# Patient Record
Sex: Female | Born: 1980 | Race: White | Hispanic: No | Marital: Married | Smoking: Never smoker
Health system: Southern US, Community
[De-identification: ages and names within clinical notes are randomized; demographics above are authoritative.]

## PROBLEM LIST (undated history)

## (undated) DIAGNOSIS — Z309 Encounter for contraceptive management, unspecified: Secondary | ICD-10-CM

## (undated) DIAGNOSIS — C801 Malignant (primary) neoplasm, unspecified: Secondary | ICD-10-CM

## (undated) DIAGNOSIS — N921 Excessive and frequent menstruation with irregular cycle: Secondary | ICD-10-CM

## (undated) HISTORY — DX: Encounter for contraceptive management, unspecified: Z30.9

## (undated) HISTORY — DX: Excessive and frequent menstruation with irregular cycle: N92.1

## (undated) HISTORY — DX: Malignant (primary) neoplasm, unspecified: C80.1

## (undated) HISTORY — PX: SKIN CANCER EXCISION: SHX779

## (undated) HISTORY — PX: TONSILLECTOMY: SUR1361

---

## 2007-04-15 ENCOUNTER — Ambulatory Visit (HOSPITAL_COMMUNITY): Admission: RE | Admit: 2007-04-15 | Discharge: 2007-04-15 | Payer: Self-pay | Admitting: Obstetrics & Gynecology

## 2008-06-23 ENCOUNTER — Other Ambulatory Visit: Admission: RE | Admit: 2008-06-23 | Discharge: 2008-06-23 | Payer: Self-pay | Admitting: Obstetrics and Gynecology

## 2008-12-09 IMAGING — US US PELVIS COMPLETE MODIFY
1 series · 14 of 25 positions shown · non-contrast
Comparison: None

CLINICAL DATA: Pelvic pressure

TRANSABDOMINAL AND TRANSVAGINAL PELVIC ULTRASOUND
TECHNIQUE: Both transabdominal and transvaginal ultrasound examinations of the
pelvis were performed including evaluation of the uterus, ovaries, adnexal
regions, and pelvic cul-de-sac.

[Series 1: us pelvis complete modify · 0.28mm/px · 14 of 58 slices shown]
[im 1/58]
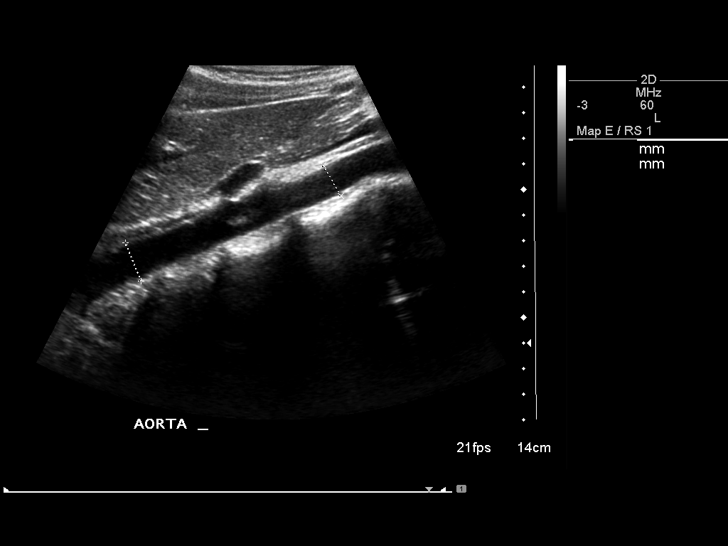
[im 5/58]
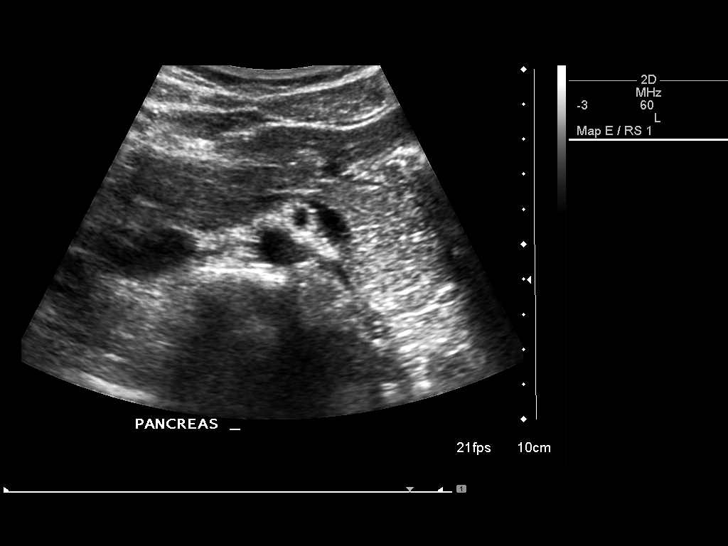
[im 10/58]
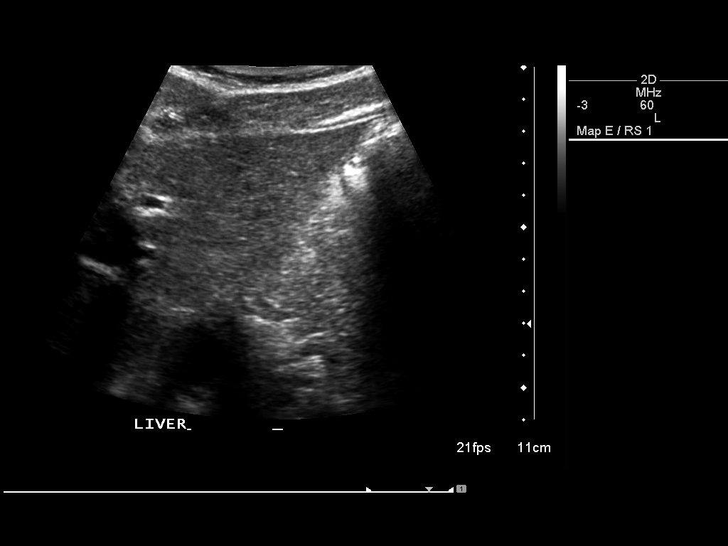
[im 15/58]
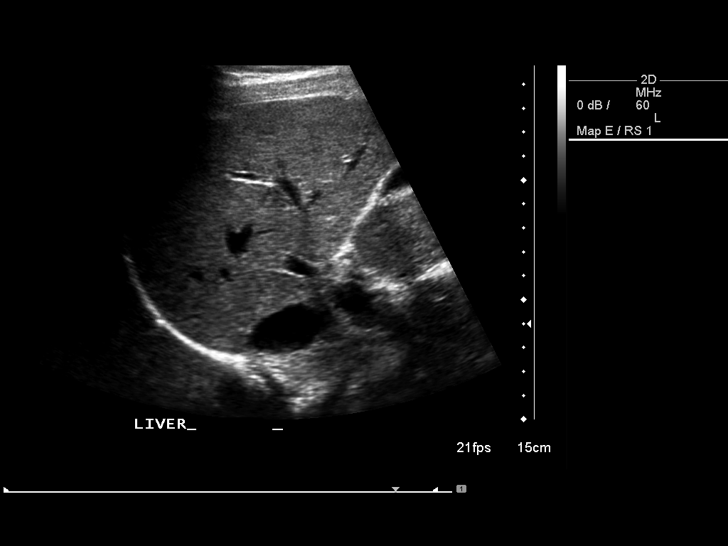
[im 20/58]
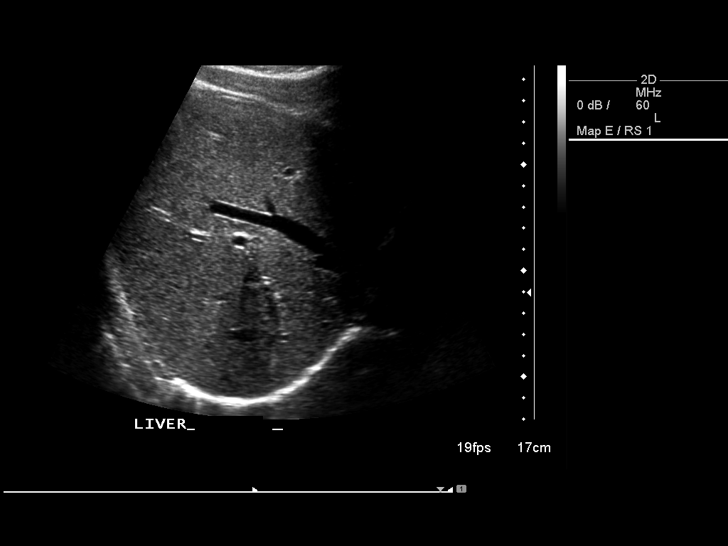
[im 22/58]
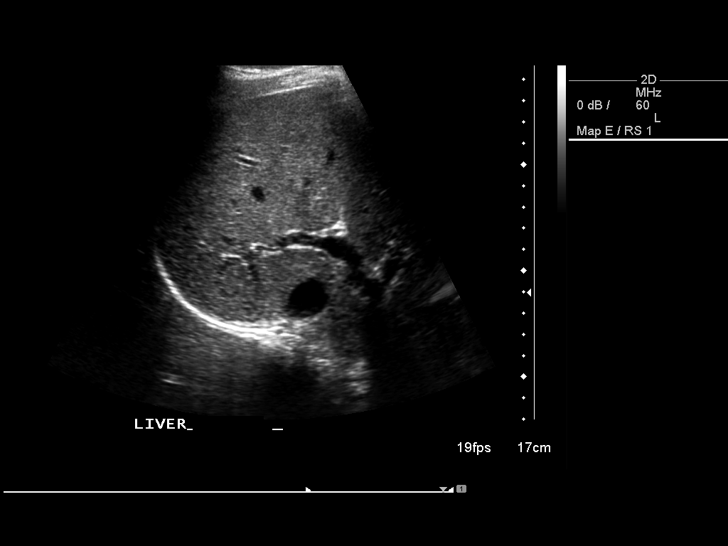
[im 27/58]
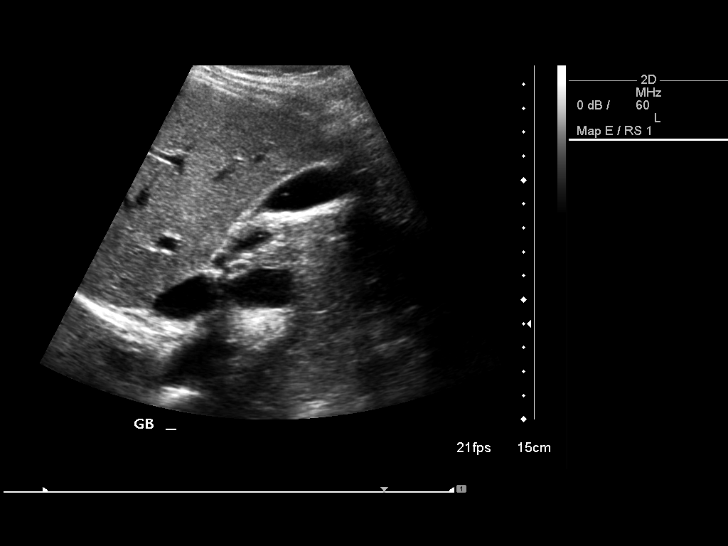
[im 31/58]
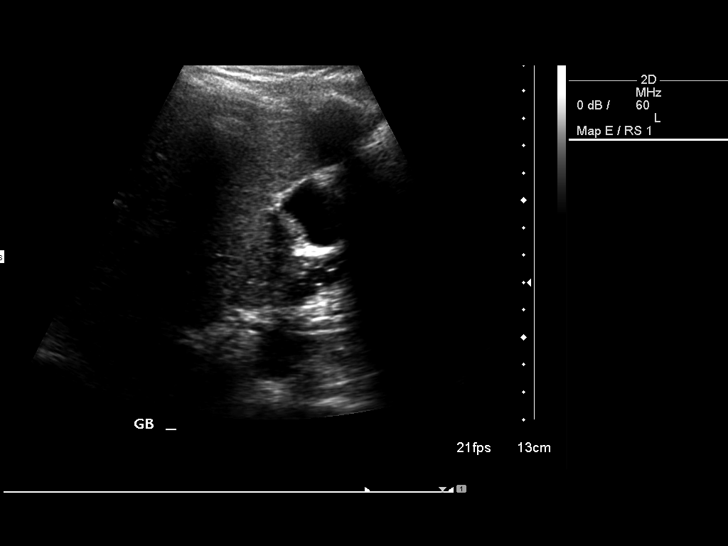
[im 36/58]
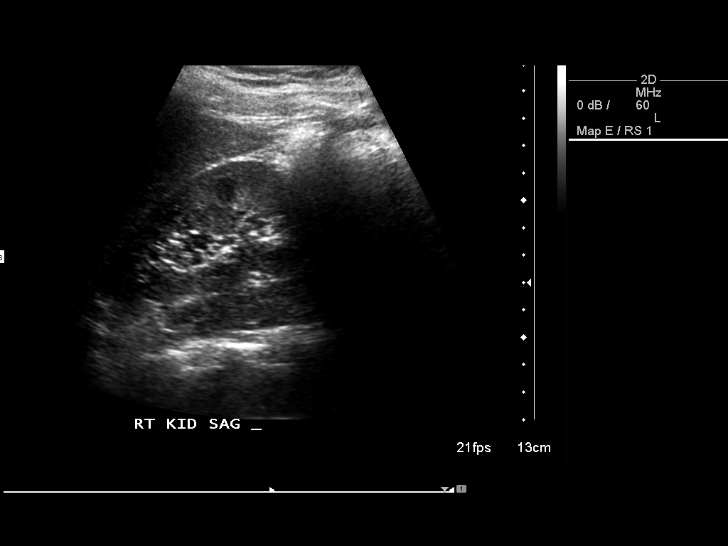
[im 39/58]
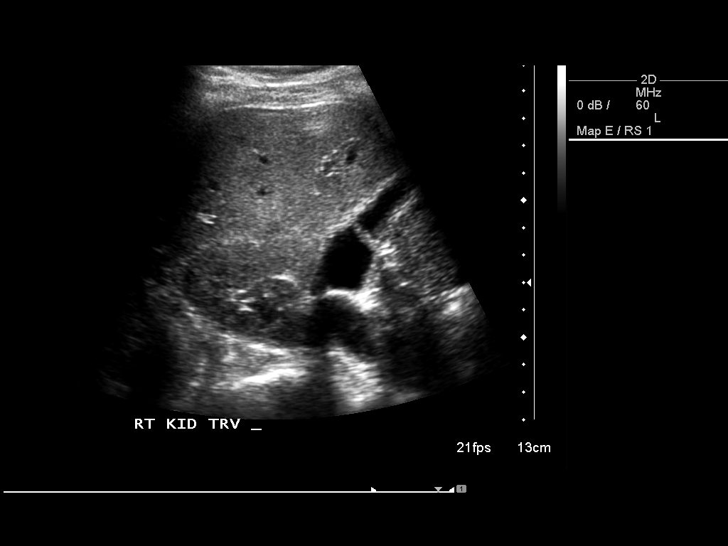
[im 43/58]
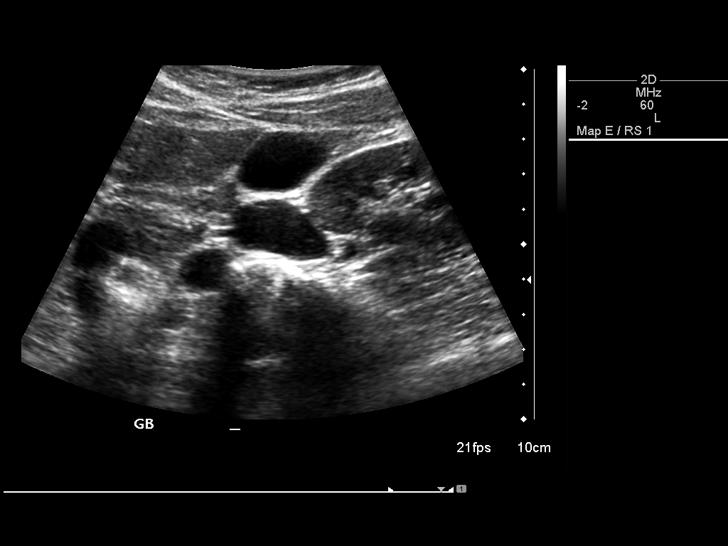
[im 48/58]
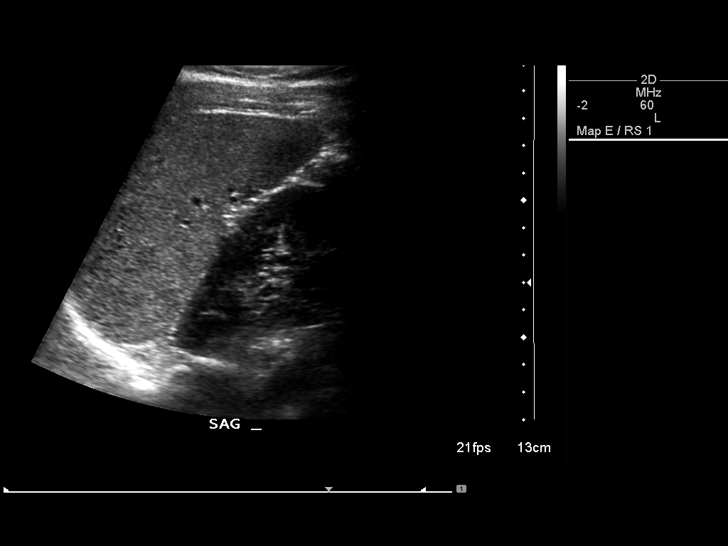
[im 53/58]
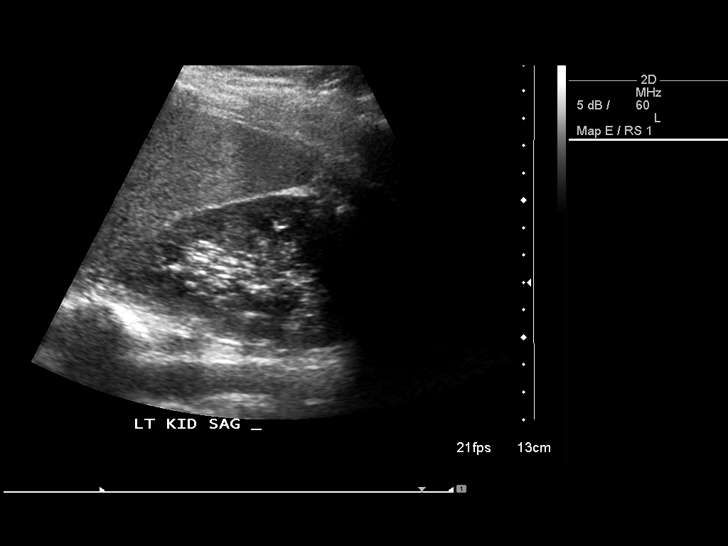
[im 58/58]
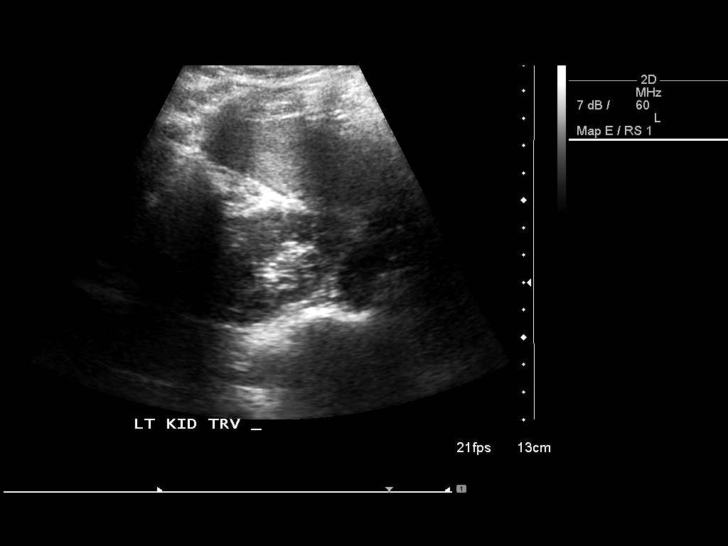

[14 of 25 positions shown; findings below may reference images not displayed]

FINDINGS: Uterus is normal appearance measuring 7.8 x 2.6 x 4.3 cm.

Endometrium is normal measuring 2 mm.

Right ovary is normal measuring 2.9 x 1.5 x 2.1 cm

Left ovary is normal measuring 3.1 x 1.6 x 1.7 cm.

There is no free fluid.

IMPRESSION

Normal pelvic sonogram.

## 2008-12-09 IMAGING — US US PELVIS COMPLETE MODIFY
1 series · 14 of 25 positions shown · non-contrast
Comparison: None

CLINICAL DATA: Pelvic pressure

TRANSABDOMINAL AND TRANSVAGINAL PELVIC ULTRASOUND
TECHNIQUE: Both transabdominal and transvaginal ultrasound examinations of the
pelvis were performed including evaluation of the uterus, ovaries, adnexal
regions, and pelvic cul-de-sac.

[Series 1: us pelvis complete modify · 0.24mm/px · 14 of 45 slices shown]
[im 1/45]
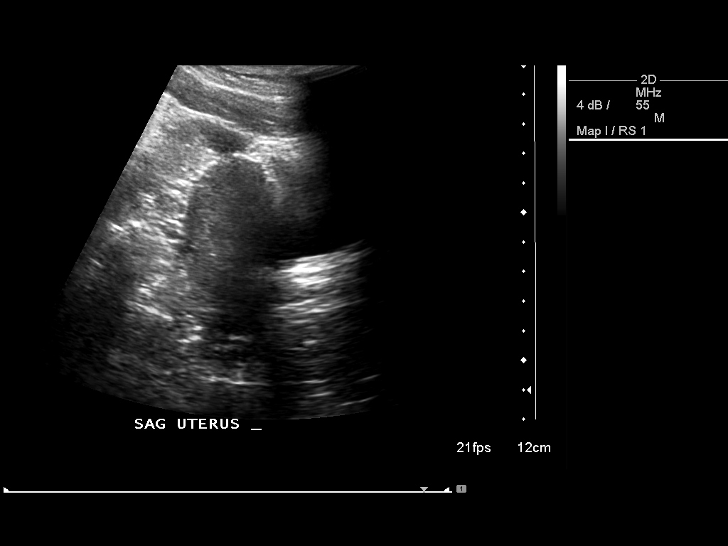
[im 4/45]
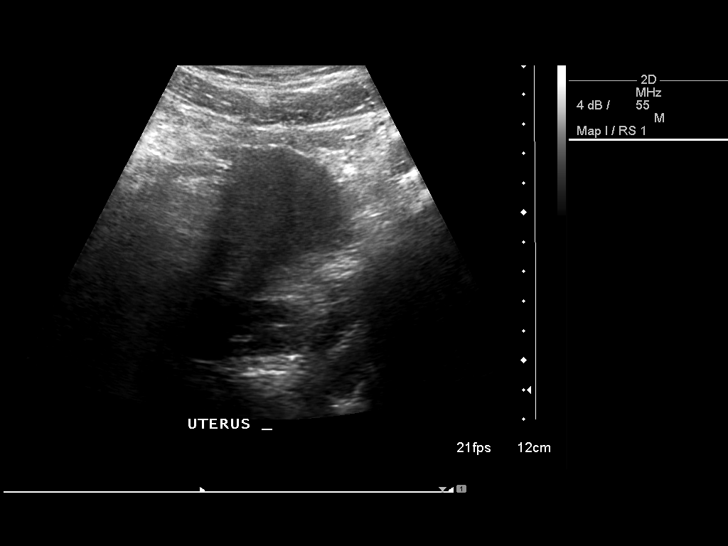
[im 8/45]
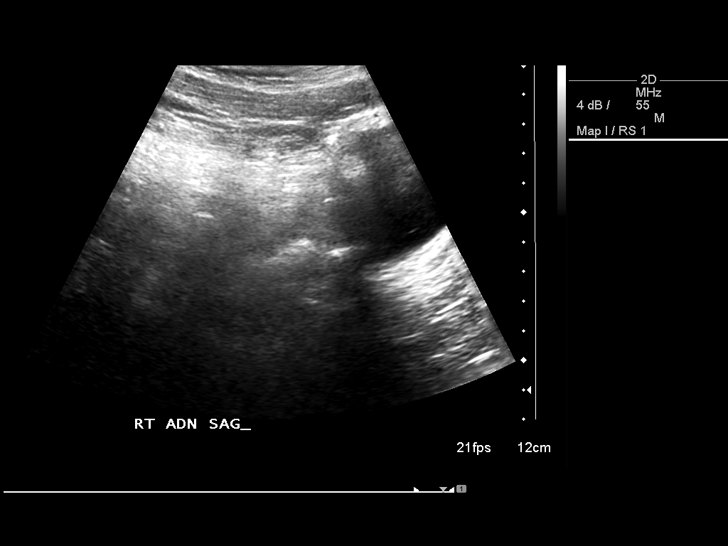
[im 12/45]
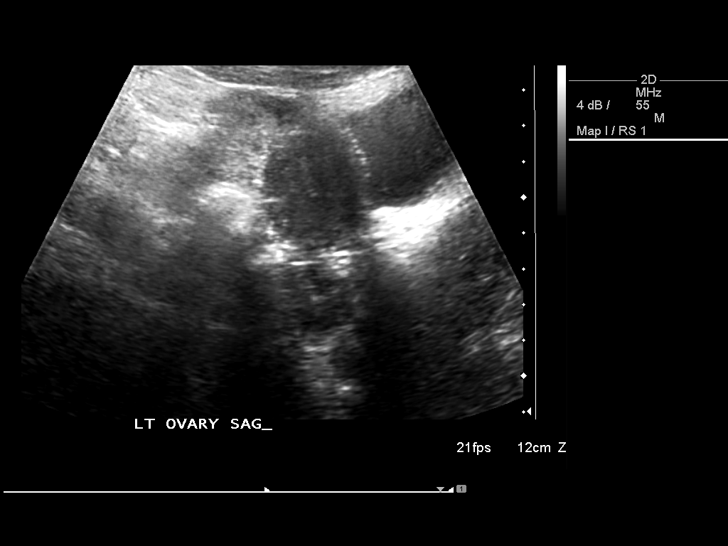
[im 15/45]
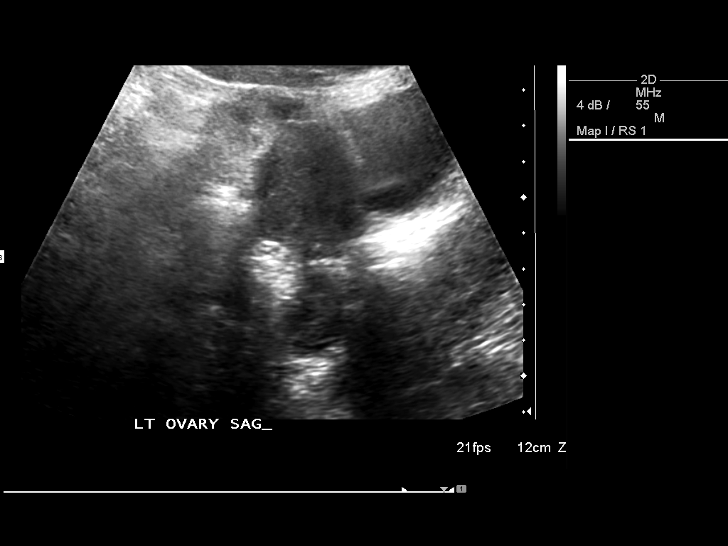
[im 17/45]
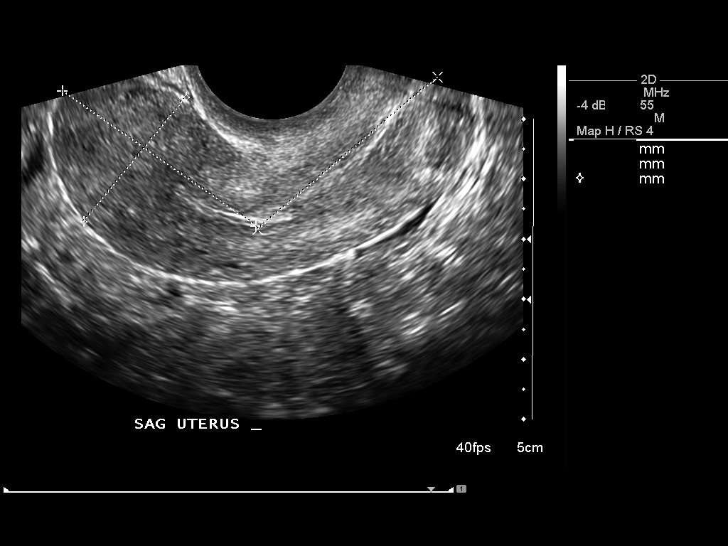
[im 21/45]
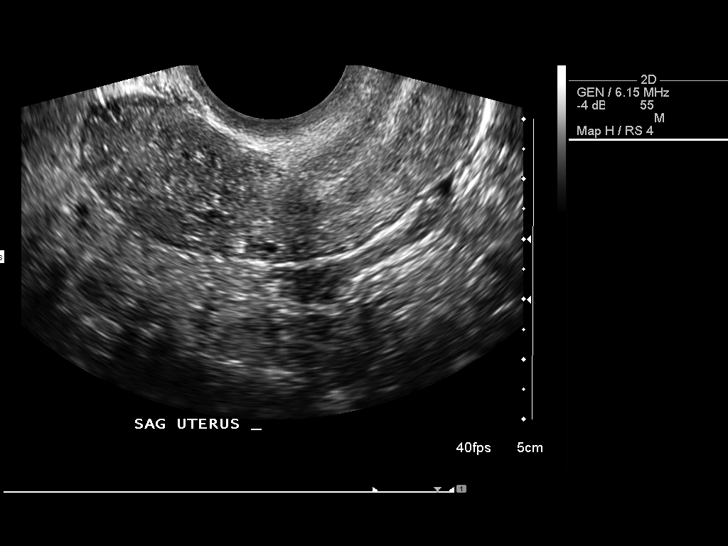
[im 24/45]
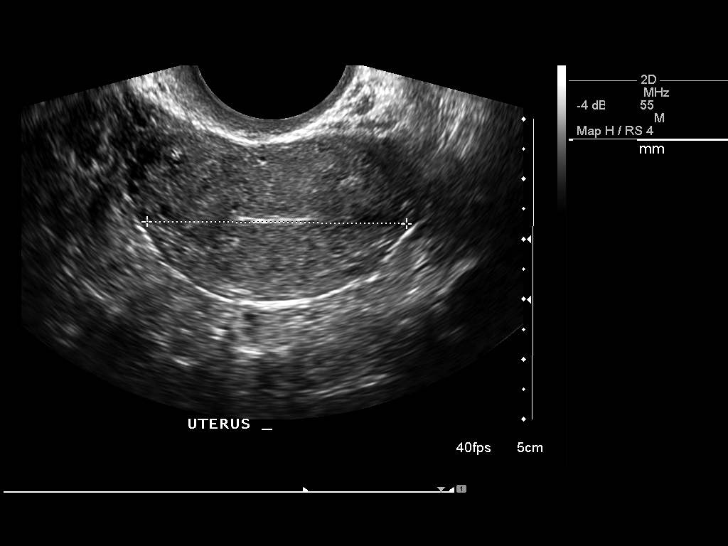
[im 28/45]
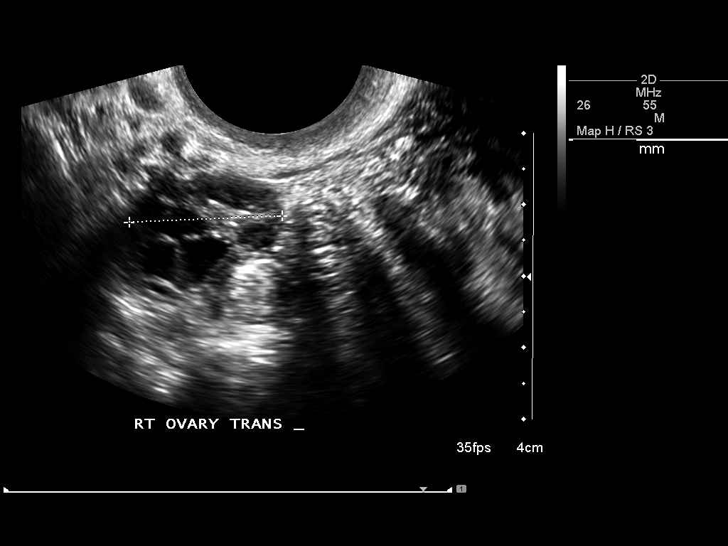
[im 30/45]
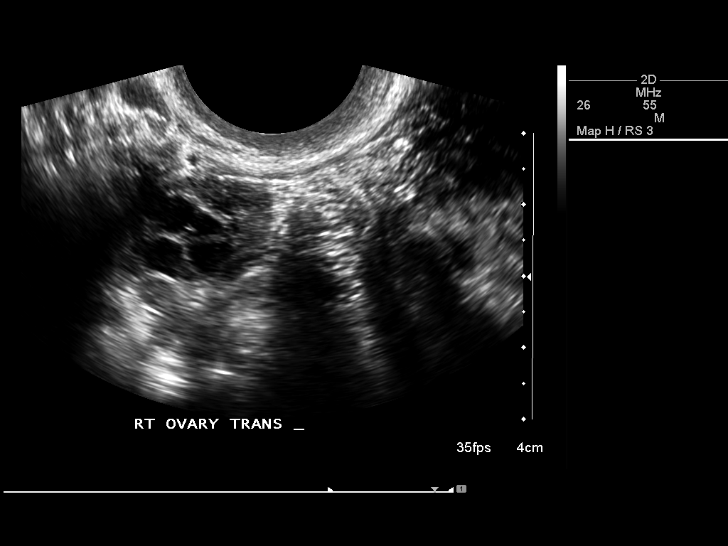
[im 34/45]
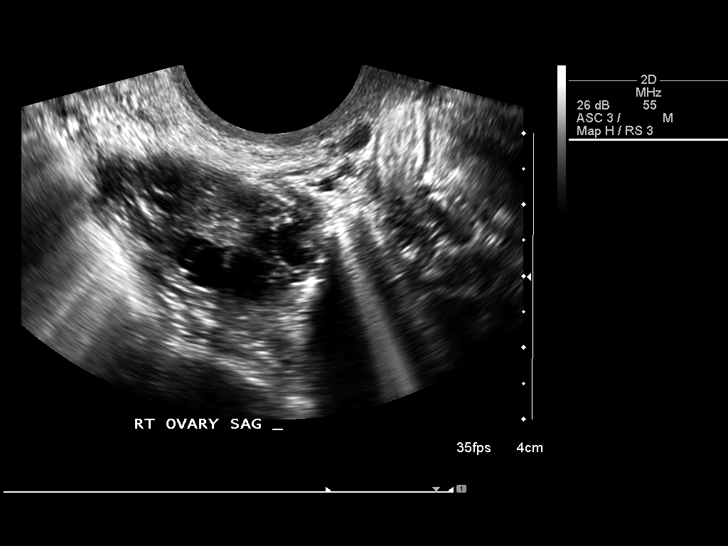
[im 37/45]
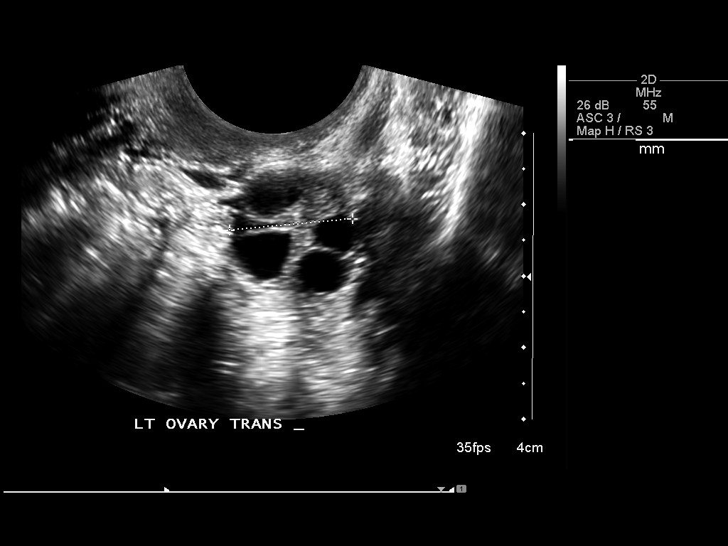
[im 41/45]
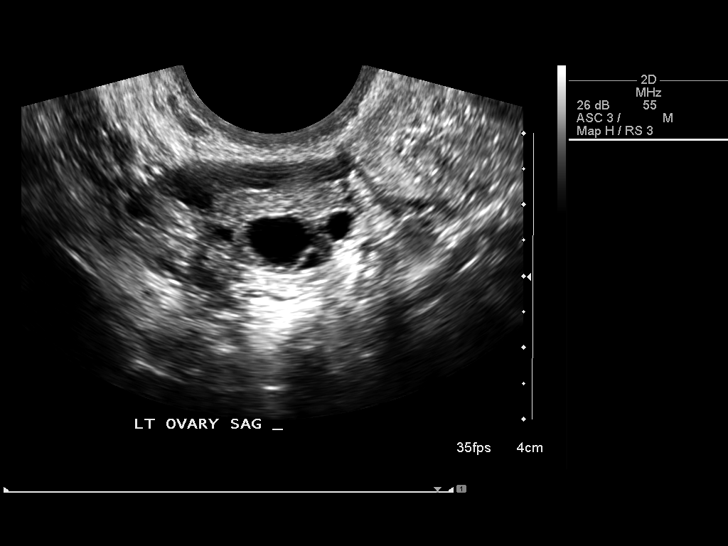
[im 45/45]
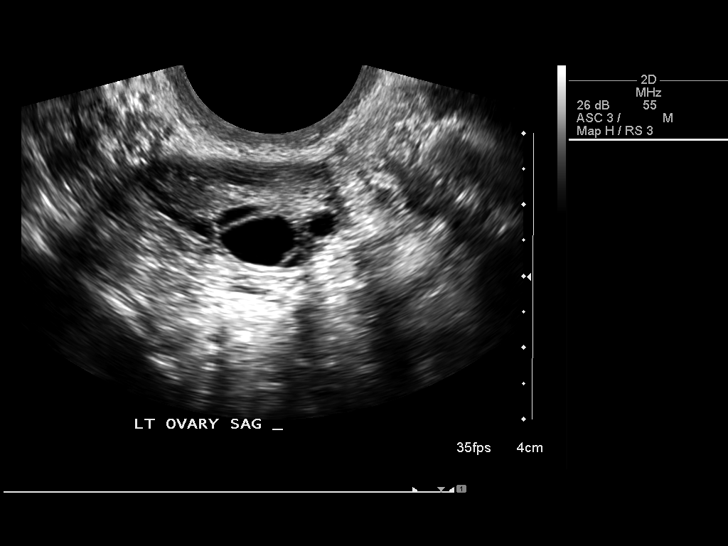

[14 of 25 positions shown; findings below may reference images not displayed]

FINDINGS: Uterus is normal appearance measuring 7.8 x 2.6 x 4.3 cm.

Endometrium is normal measuring 2 mm.

Right ovary is normal measuring 2.9 x 1.5 x 2.1 cm

Left ovary is normal measuring 3.1 x 1.6 x 1.7 cm.

There is no free fluid.

IMPRESSION

Normal pelvic sonogram.

## 2009-01-08 ENCOUNTER — Ambulatory Visit: Payer: Self-pay | Admitting: Family

## 2009-01-08 ENCOUNTER — Inpatient Hospital Stay (HOSPITAL_COMMUNITY): Admission: AD | Admit: 2009-01-08 | Discharge: 2009-01-10 | Payer: Self-pay | Admitting: Obstetrics and Gynecology

## 2009-01-15 ENCOUNTER — Emergency Department (HOSPITAL_COMMUNITY): Admission: EM | Admit: 2009-01-15 | Discharge: 2009-01-15 | Payer: Self-pay | Admitting: Emergency Medicine

## 2011-04-16 LAB — URINALYSIS, ROUTINE W REFLEX MICROSCOPIC
Bilirubin Urine: NEGATIVE
Glucose, UA: NEGATIVE mg/dL
Protein, ur: NEGATIVE mg/dL
Specific Gravity, Urine: 1.015 (ref 1.005–1.030)

## 2011-04-16 LAB — CBC
HCT: 39.5 % (ref 36.0–46.0)
Hemoglobin: 13.5 g/dL (ref 12.0–15.0)
MCV: 93.1 fL (ref 78.0–100.0)
RDW: 14.4 % (ref 11.5–15.5)

## 2011-04-16 LAB — URINE MICROSCOPIC-ADD ON

## 2012-01-01 NOTE — L&D Delivery Note (Signed)
CRISTELA STALDER is a 31 y.o. G3P1011 at [redacted]w[redacted]d who presented with SROM in active labor at 6 cm dilation. She progressed rapidly to full dilation.   Delivery Note At 11:44 PM a viable and healthy female was delivered via Vaginal, Spontaneous Delivery (Presentation: Vertex; Right Occiput Anterior).  APGAR: 8, 9; weight pending.   Placenta status: Intact, Spontaneous.  Cord: 3 vessels with the following complications: None.  Cord pH: n/a  Anesthesia: Local  Episiotomy: None Lacerations: 2nd degree Suture Repair: 3.0 vicryl Est. Blood Loss (mL): 200  Mom to postpartum.  Baby to nursery-stable.  Napoleon Form 11/12/2012, 12:53 AM

## 2012-01-29 ENCOUNTER — Other Ambulatory Visit (HOSPITAL_COMMUNITY)
Admission: RE | Admit: 2012-01-29 | Discharge: 2012-01-29 | Disposition: A | Discharge status: Home / Self Care | Payer: 59 | Source: Ambulatory Visit | Attending: Obstetrics and Gynecology | Admitting: Obstetrics and Gynecology

## 2012-01-29 DIAGNOSIS — Z01419 Encounter for gynecological examination (general) (routine) without abnormal findings: Secondary | ICD-10-CM | POA: Insufficient documentation

## 2012-03-12 LAB — OB RESULTS CONSOLE ABO/RH: RH Type: POSITIVE

## 2012-09-02 ENCOUNTER — Other Ambulatory Visit: Payer: Self-pay | Admitting: Obstetrics & Gynecology

## 2012-10-27 LAB — OB RESULTS CONSOLE GBS: GBS: NEGATIVE

## 2012-11-11 ENCOUNTER — Encounter (HOSPITAL_COMMUNITY): Payer: Self-pay | Admitting: Obstetrics

## 2012-11-11 ENCOUNTER — Encounter (HOSPITAL_COMMUNITY): Payer: Self-pay | Admitting: *Deleted

## 2012-11-11 ENCOUNTER — Inpatient Hospital Stay (HOSPITAL_COMMUNITY)
Admission: AD | Admit: 2012-11-11 | Discharge: 2012-11-13 | DRG: 775 | Disposition: A | Discharge status: Home / Self Care | Payer: 59 | Source: Ambulatory Visit | Attending: Obstetrics & Gynecology | Admitting: Obstetrics & Gynecology

## 2012-11-11 ENCOUNTER — Telehealth (HOSPITAL_COMMUNITY): Payer: Self-pay | Admitting: *Deleted

## 2012-11-11 MED ORDER — ONDANSETRON HCL 4 MG/2ML IJ SOLN: 4.0000 mg | Freq: Four times a day (QID) | INTRAMUSCULAR | Status: DC | PRN

## 2012-11-11 MED ORDER — LIDOCAINE HCL (PF) 1 % IJ SOLN
30.0000 mL | INTRAMUSCULAR | Status: DC | PRN
  Administered 2012-11-11: 30 mL via SUBCUTANEOUS

## 2012-11-11 MED ORDER — OXYTOCIN 10 UNIT/ML IJ SOLN
10.0000 [IU] | Freq: Once | INTRAMUSCULAR | Status: AC
  Administered 2012-11-11: 10 [IU] via INTRAMUSCULAR

## 2012-11-11 MED ORDER — LACTATED RINGERS IV SOLN: 500.0000 mL | INTRAVENOUS | Status: DC | PRN

## 2012-11-11 MED ORDER — BUTORPHANOL TARTRATE 1 MG/ML IJ SOLN
1.0000 mg | INTRAMUSCULAR | Status: DC | PRN
  Filled 2012-11-11: qty 1

## 2012-11-11 MED ORDER — OXYTOCIN 10 UNIT/ML IJ SOLN
INTRAMUSCULAR | Status: AC
  Filled 2012-11-11: qty 2

## 2012-11-11 MED ORDER — IBUPROFEN 600 MG PO TABS
600.0000 mg | ORAL_TABLET | Freq: Four times a day (QID) | ORAL | Status: DC | PRN
  Administered 2012-11-12: 600 mg via ORAL
  Filled 2012-11-11: qty 1

## 2012-11-11 MED ORDER — ACETAMINOPHEN 325 MG PO TABS: 650.0000 mg | ORAL_TABLET | ORAL | Status: DC | PRN

## 2012-11-11 MED ORDER — OXYCODONE-ACETAMINOPHEN 5-325 MG PO TABS: 1.0000 | ORAL_TABLET | ORAL | Status: DC | PRN

## 2012-11-11 MED ORDER — OXYTOCIN BOLUS FROM INFUSION: 500.0000 mL | INTRAVENOUS | Status: DC

## 2012-11-11 MED ORDER — LACTATED RINGERS IV SOLN: INTRAVENOUS | Status: DC

## 2012-11-11 MED ORDER — LIDOCAINE HCL (PF) 1 % IJ SOLN
INTRAMUSCULAR | Status: AC
  Filled 2012-11-11: qty 30

## 2012-11-11 MED ORDER — CITRIC ACID-SODIUM CITRATE 334-500 MG/5ML PO SOLN: 30.0000 mL | ORAL | Status: DC | PRN

## 2012-11-11 MED ORDER — OXYTOCIN 40 UNITS IN LACTATED RINGERS INFUSION - SIMPLE MED: 62.5000 mL/h | INTRAVENOUS | Status: DC

## 2012-11-11 MED ORDER — BUTORPHANOL TARTRATE 1 MG/ML IJ SOLN
1.0000 mg | INTRAMUSCULAR | Status: DC | PRN
  Administered 2012-11-11: 1 mg via INTRAMUSCULAR

## 2012-11-11 NOTE — MAU Note (Signed)
Dr. Thad Ranger at the bedside to assess patient.

## 2012-11-11 NOTE — H&P (Signed)
Sharon Murillo is a 31 y.o. female presenting for active labor. Maternal Medical History:  Reason for admission: Reason for admission: rupture of membranes and contractions.  Reason for Admission:   nauseaContractions: Onset was 1-2 hours ago.   Frequency: regular.   Perceived severity is strong.    Fetal activity: Perceived fetal activity is normal.   Last perceived fetal movement was within the past hour.    Prenatal complications: no prenatal complications Prenatal Complications - Diabetes: none.    OB History    Grav Para Term Preterm Abortions TAB SAB Ect Mult Living   3 1 1  1  1   1      Past Medical History  Diagnosis Date  . Cancer     skin   Past Surgical History  Procedure Date  . Tonsillectomy   . Skin cancer excision    Family History: family history is negative for Other. Social History:  reports that she has never smoked. She has never used smokeless tobacco. She reports that she does not drink alcohol or use illicit drugs.   Prenatal Transfer Tool  Maternal Diabetes: No Genetic Screening: Declined Maternal Ultrasounds/Referrals: Normal Fetal Ultrasounds or other Referrals:  None Maternal Substance Abuse:  No Significant Maternal Medications:  None Significant Maternal Lab Results:  Lab values include: Group B Strep negative Other Comments:  None  Review of Systems  Constitutional: Negative for fever and chills.  Gastrointestinal: Negative for nausea and vomiting.    Dilation: 6 Effacement (%): 80 Station: -2 Exam by:: L. Munford RN Blood pressure 136/70, pulse 104, temperature 97.5 F (36.4 C), temperature source Oral, resp. rate 20, last menstrual period 01/23/2012. Maternal Exam:  Uterine Assessment: Contraction strength is firm.  Contraction frequency is regular.   Abdomen: Fetal presentation: vertex  Introitus: Amniotic fluid character: clear.  Pelvis: adequate for delivery.   Cervix: Cervix evaluated by digital exam.     Fetal  Exam Fetal Monitor Review: Mode: ultrasound.   Baseline rate: 130.  Variability: moderate (6-25 bpm).   Pattern: no decelerations.       Physical Exam  Constitutional: She is oriented to person, place, and time. She appears well-developed and well-nourished. She appears distressed.  HENT:  Head: Normocephalic and atraumatic.  Eyes: Conjunctivae normal and EOM are normal.  Neck: Normal range of motion. Neck supple.  Cardiovascular: Normal rate.   Respiratory: Effort normal. No respiratory distress.  GI: Soft. There is no tenderness.  Musculoskeletal: She exhibits no edema and no tenderness.  Neurological: She is alert and oriented to person, place, and time.  Skin: Skin is warm and dry.    Prenatal labs: ABO, Rh: O/Positive/-- (03/13 0000) Antibody: Negative (03/13 0000) Rubella: Immune (03/13 0000) RPR: Nonreactive (03/13 0000)  HBsAg: Negative (03/13 0000)  HIV: Non-reactive (03/13 0000)  GBS: Negative (10/28 0000)   Assessment/Plan: 31 y.o. G3P1011 at [redacted]w[redacted]d with active labor. - Admit to L&D - GBS negative - Stadol for pain - Anticipate SVD   Napoleon Form 11/11/2012, 11:18 PM

## 2012-11-11 NOTE — Telephone Encounter (Signed)
Preadmission screen  

## 2012-11-12 ENCOUNTER — Encounter (HOSPITAL_COMMUNITY): Payer: Self-pay | Admitting: Obstetrics

## 2012-11-12 LAB — CBC
HCT: 33.8 % — ABNORMAL LOW (ref 36.0–46.0)
HCT: 35 % — ABNORMAL LOW (ref 36.0–46.0)
Hemoglobin: 11.1 g/dL — ABNORMAL LOW (ref 12.0–15.0)
Hemoglobin: 11.8 g/dL — ABNORMAL LOW (ref 12.0–15.0)
MCV: 85.4 fL (ref 78.0–100.0)
WBC: 16.7 10*3/uL — ABNORMAL HIGH (ref 4.0–10.5)

## 2012-11-12 LAB — TYPE AND SCREEN

## 2012-11-12 LAB — RPR: RPR Ser Ql: NONREACTIVE

## 2012-11-12 MED ORDER — WITCH HAZEL-GLYCERIN EX PADS: 1.0000 "application " | MEDICATED_PAD | CUTANEOUS | Status: DC | PRN

## 2012-11-12 MED ORDER — ONDANSETRON HCL 4 MG PO TABS: 4.0000 mg | ORAL_TABLET | ORAL | Status: DC | PRN

## 2012-11-12 MED ORDER — SIMETHICONE 80 MG PO CHEW: 80.0000 mg | CHEWABLE_TABLET | ORAL | Status: DC | PRN

## 2012-11-12 MED ORDER — SENNOSIDES-DOCUSATE SODIUM 8.6-50 MG PO TABS
2.0000 | ORAL_TABLET | Freq: Every day | ORAL | Status: DC
  Administered 2012-11-12: 2 via ORAL

## 2012-11-12 MED ORDER — DIPHENHYDRAMINE HCL 25 MG PO CAPS: 25.0000 mg | ORAL_CAPSULE | Freq: Four times a day (QID) | ORAL | Status: DC | PRN

## 2012-11-12 MED ORDER — LANOLIN HYDROUS EX OINT: TOPICAL_OINTMENT | CUTANEOUS | Status: DC | PRN

## 2012-11-12 MED ORDER — IBUPROFEN 600 MG PO TABS
600.0000 mg | ORAL_TABLET | Freq: Four times a day (QID) | ORAL | Status: DC
  Administered 2012-11-12 – 2012-11-13 (×5): 600 mg via ORAL
  Filled 2012-11-12 (×5): qty 1

## 2012-11-12 MED ORDER — ZOLPIDEM TARTRATE 5 MG PO TABS: 5.0000 mg | ORAL_TABLET | Freq: Every evening | ORAL | Status: DC | PRN

## 2012-11-12 MED ORDER — TETANUS-DIPHTH-ACELL PERTUSSIS 5-2.5-18.5 LF-MCG/0.5 IM SUSP: 0.5000 mL | Freq: Once | INTRAMUSCULAR | Status: DC

## 2012-11-12 MED ORDER — BENZOCAINE-MENTHOL 20-0.5 % EX AERO: 1.0000 "application " | INHALATION_SPRAY | CUTANEOUS | Status: DC | PRN

## 2012-11-12 MED ORDER — ONDANSETRON HCL 4 MG/2ML IJ SOLN: 4.0000 mg | INTRAMUSCULAR | Status: DC | PRN

## 2012-11-12 MED ORDER — OXYCODONE-ACETAMINOPHEN 5-325 MG PO TABS: 1.0000 | ORAL_TABLET | ORAL | Status: DC | PRN

## 2012-11-12 MED ORDER — DIBUCAINE 1 % RE OINT: 1.0000 "application " | TOPICAL_OINTMENT | RECTAL | Status: DC | PRN

## 2012-11-12 MED ORDER — PRENATAL MULTIVITAMIN CH
1.0000 | ORAL_TABLET | Freq: Every day | ORAL | Status: DC
  Administered 2012-11-12 – 2012-11-13 (×2): 1 via ORAL
  Filled 2012-11-12 (×2): qty 1

## 2012-11-12 NOTE — Progress Notes (Signed)
Post Partum Day 1 Subjective: Pt reports minimal pain due to uterine "cramping", fairly well-controlled with ibuprofen. Up ad lib, voiding, tolerating PO, + flatus. Moderate bleeding, reports soaking through 1-2 pads since delivery. Denies fever, chills, nausea, vomiting, headache, visual changes, chest pain, palpitations, shortness of breath, other abdominal pain, vaginal pain, or lower extremity edema.  Objective: Blood pressure 116/73, pulse 69, temperature 97.8 F (36.6 C), temperature source Oral, resp. rate 18, height 5\' 3"  (1.6 m), weight 73.936 kg (163 lb), last menstrual period 01/23/2012, SpO2 97.00%, unknown if currently breastfeeding.  Physical Exam:  General: alert, cooperative and no distress Lochia: appropriate Uterine Fundus: firm DVT Evaluation: No evidence of DVT seen on physical exam. No cords or calf tenderness. No significant calf/ankle edema.   Basename 11/12/12 0515 11/12/12 0020  HGB 11.1* 11.8*  HCT 33.8* 35.0*    Assessment/Plan: Plan for discharge tomorrow, Breastfeeding, Circumcision prior to discharge. [facility]partum care will be at [facility]. Pt is undecided about contraception. Would not like anything prescribed or given before she leaves here. Is considering minipill and plans to discuss with Dr. Emelda Fear or other [facility] provider.   LOS: 1 day   Harriet Masson 11/12/2012, 7:30 AM

## 2012-11-12 NOTE — Progress Notes (Signed)
I have seen and examined patient and agree with above. Baby nursing well. Napoleon Form, MD

## 2012-11-13 MED ORDER — IBUPROFEN 600 MG PO TABS: 600.0000 mg | ORAL_TABLET | Freq: Four times a day (QID) | ORAL | Status: DC

## 2012-11-13 NOTE — Discharge Summary (Signed)
Obstetric Discharge Summary Reason for Admission: onset of labor Prenatal Procedures: none Intrapartum Procedures: spontaneous vaginal delivery Postpartum Procedures: none Complications-Operative and Postpartum: 2nd degree perineal laceration Hemoglobin  Date Value Range Status  11/12/2012 11.1* 12.0 - 15.0 g/dL Final     HCT  Date Value Range Status  11/12/2012 33.8* 36.0 - 46.0 % Final    Physical Exam:  General: alert, cooperative and no distress Lochia: appropriate Uterine Fundus: firm DVT Evaluation: No evidence of DVT seen on physical exam.  Discharge Diagnoses: Term Pregnancy-delivered  Discharge Information: Date: 11/13/2012 Activity: pelvic rest Diet: routine Medications: Ibuprofen Condition: stable Instructions: refer to practice specific booklet Discharge to: home Follow-up Information    Follow up with [facility]. (Make an appointment for 4-6 weeks postpartum)    Contact information:   [address] [place] [address]          Newborn Data: Live born female  Birth Weight: 7 lb 11.4 oz (3498 g) APGAR: 8, 9  Home with mother. Breastfeeding going slowly but improving. Wants Micronor for contraception but will get that from postpartum visit. Plan infant circ for today prior to d/c.  Cam Hai 11/13/2012, 7:26 AM

## 2012-11-15 ENCOUNTER — Inpatient Hospital Stay (HOSPITAL_COMMUNITY): Admission: RE | Admit: 2012-11-15 | Payer: 59 | Source: Ambulatory Visit

## 2014-02-25 ENCOUNTER — Other Ambulatory Visit: Payer: Self-pay | Admitting: Adult Health

## 2014-03-10 ENCOUNTER — Ambulatory Visit (INDEPENDENT_AMBULATORY_CARE_PROVIDER_SITE_OTHER): Payer: 59 | Admitting: Adult Health

## 2014-03-10 ENCOUNTER — Other Ambulatory Visit (HOSPITAL_COMMUNITY)
Admission: RE | Admit: 2014-03-10 | Discharge: 2014-03-10 | Disposition: A | Discharge status: Home / Self Care | Payer: 59 | Source: Ambulatory Visit | Attending: Adult Health | Admitting: Adult Health

## 2014-03-10 ENCOUNTER — Encounter (INDEPENDENT_AMBULATORY_CARE_PROVIDER_SITE_OTHER): Payer: Self-pay

## 2014-03-10 ENCOUNTER — Encounter: Payer: Self-pay | Admitting: Adult Health

## 2014-03-10 VITALS — BP 102/60 | HR 74 | Ht 63.25 in | Wt 136.0 lb

## 2014-03-10 DIAGNOSIS — Z1151 Encounter for screening for human papillomavirus (HPV): Secondary | ICD-10-CM | POA: Insufficient documentation

## 2014-03-10 DIAGNOSIS — Z309 Encounter for contraceptive management, unspecified: Secondary | ICD-10-CM | POA: Insufficient documentation

## 2014-03-10 DIAGNOSIS — Z01419 Encounter for gynecological examination (general) (routine) without abnormal findings: Secondary | ICD-10-CM | POA: Insufficient documentation

## 2014-03-10 HISTORY — DX: Encounter for contraceptive management, unspecified: Z30.9

## 2014-03-10 MED ORDER — NORGESTIM-ETH ESTRAD TRIPHASIC 0.18/0.215/0.25 MG-35 MCG PO TABS: 1.0000 | ORAL_TABLET | Freq: Every day | ORAL | Status: DC

## 2014-03-10 NOTE — Progress Notes (Signed)
Patient ID: SHANINA KEPPLE, female   DOB: 08/24/1981, 33 y.o.   MRN: [id number] History of Present Illness: Muna is a 33 year old white female, married in for a pap and physical, has noticed vaginal odor at times.Happy with OCs.   Current Medications, Allergies, Past Medical History, Past Surgical History, Family History and Social History were reviewed in [employer] record.     Review of Systems: Patient denies any headaches, blurred vision, shortness of breath, chest pain, abdominal pain, problems with bowel movements, urination, or intercourse. No joint pain or mood swings.see HPI for positive.    Physical Exam:BP 102/60  Pulse 74  Ht 5' 3.25" (1.607 m)  Wt 136 lb (61.689 kg)  BMI 23.89 kg/m2  LMP 02/24/2014 General:  Well developed, well nourished, no acute distress Skin:  Warm and dry Neck:  Midline trachea, normal thyroid Lungs; Clear to auscultation bilaterally Breast:  No dominant palpable mass, retraction, or nipple discharge Cardiovascular: Regular rate and rhythm Abdomen:  Soft, non tender, no hepatosplenomegaly Pelvic:  External genitalia is normal in appearance.  The vagina is normal in appearance, no odor. The cervix is bulbous.Pap with HPV performed.  Uterus is felt to be normal size, shape, and contour.  No      adnexal masses or tenderness noted. Extremities:  No swelling or varicosities noted Psych:  No mood changes, alert and cooperative, seems happy   Impression: Yearly gyn exam Contraceptive management   Plan: Refilled tri sprintec x 1 year Physical  In 1 year Call prn

## 2014-03-10 NOTE — Patient Instructions (Signed)
Physical in 1 year Continue OCs  call prn

## 2014-11-01 ENCOUNTER — Encounter: Payer: Self-pay | Admitting: Adult Health

## 2015-03-10 ENCOUNTER — Other Ambulatory Visit: Payer: Self-pay | Admitting: Adult Health

## 2015-03-11 ENCOUNTER — Other Ambulatory Visit: Payer: Self-pay | Admitting: Adult Health

## 2015-03-22 ENCOUNTER — Other Ambulatory Visit: Payer: Self-pay | Admitting: Adult Health

## 2015-03-24 ENCOUNTER — Encounter: Payer: Self-pay | Admitting: Adult Health

## 2015-03-24 ENCOUNTER — Ambulatory Visit (INDEPENDENT_AMBULATORY_CARE_PROVIDER_SITE_OTHER): Payer: BLUE CROSS/BLUE SHIELD | Admitting: Adult Health

## 2015-03-24 VITALS — BP 98/72 | HR 76 | Ht 63.0 in | Wt 146.5 lb

## 2015-03-24 DIAGNOSIS — Z01419 Encounter for gynecological examination (general) (routine) without abnormal findings: Secondary | ICD-10-CM | POA: Diagnosis not present

## 2015-03-24 DIAGNOSIS — Z3041 Encounter for surveillance of contraceptive pills: Secondary | ICD-10-CM

## 2015-03-24 DIAGNOSIS — N921 Excessive and frequent menstruation with irregular cycle: Secondary | ICD-10-CM

## 2015-03-24 HISTORY — DX: Excessive and frequent menstruation with irregular cycle: N92.1

## 2015-03-24 MED ORDER — NORGESTIMATE-ETH ESTRADIOL 0.25-35 MG-MCG PO TABS: 1.0000 | ORAL_TABLET | Freq: Every day | ORAL | Status: DC

## 2015-03-24 NOTE — Patient Instructions (Signed)
Physical in 1 year Start sprintec  Use condoms for 1 month

## 2015-03-24 NOTE — Progress Notes (Signed)
Patient ID: Sharon Murillo, female   DOB: October 02, 1981, 34 y.o.   MRN: [id number] History of Present Illness: Sharon Murillo is a 34 year old white female, married in for well woman gyn exam.She had a normal pap with negative HPV 03/10/14.She is complaining of BTB 3-6 days mid cycle.   Current Medications, Allergies, Past Medical History, Past Surgical History, Family History and Social History were reviewed in [employer] record.     Review of Systems: Patient denies any headaches, hearing loss, fatigue, blurred vision, shortness of breath, chest pain, abdominal pain, problems with bowel movements, urination, or intercourse. No joint pain or mood swings.Sweats more in summer, see HPI.    Physical Exam:BP 98/72 mmHg  Pulse 76  Ht 5\' 3"  (1.6 m)  Wt 146 lb 8 oz (66.452 kg)  BMI 25.96 kg/m2  LMP 03/03/2015  Breastfeeding? No General:  Well developed, well nourished, no acute distress Skin:  Warm and dry Neck:  Midline trachea, normal thyroid, good ROM, no lymphadenopathy Lungs; Clear to auscultation bilaterally Breast:  No dominant palpable mass, retraction, or nipple discharge Cardiovascular: Regular rate and rhythm Abdomen:  Soft, non tender, no hepatosplenomegaly Pelvic:  External genitalia is normal in appearance, no lesions.  The vagina is normal in appearance, with good color, moisture and rugae. Urethra has no lesions or masses. The cervix is bulbous.  Uterus is felt to be normal size, shape, and contour.  No adnexal masses or tenderness noted.Bladder is non tender, no masses felt. Extremities/musculoskeletal:  No swelling or varicosities noted, no clubbing or cyanosis Psych:  No mood changes, alert and cooperative,seems happy Discussed changing OCs to monophasic pill to see if helps and she agrees.  Impression: Well woman gyn exam no pap Contraceptive management BTB    Plan: Finish current pack of tri sprintec and start sprintec, use condoms for 1 month,  Rx sprintec disp 1 pack take 1 daily with 11 refills  Physical in 1 year  Try secret clinical strength clear gel

## 2015-04-18 ENCOUNTER — Telehealth: Payer: Self-pay | Admitting: Adult Health

## 2015-04-18 NOTE — Telephone Encounter (Signed)
Spoke with pt. Pt has been on Sprintec x 1 week. Pt has noticed a big change in her mood, frustration, and anxiety. Pt wants to know if this will pass. I spoke with Maudie Mercury and she advised it may pass. Try and stick with the pill for the 1st pack. If pt gets to where she don't feel like she can continue med, call us back. Pt voiced understanding. [place]

## 2016-03-27 ENCOUNTER — Other Ambulatory Visit: Payer: BLUE CROSS/BLUE SHIELD | Admitting: Adult Health

## 2018-10-02 ENCOUNTER — Ambulatory Visit (INDEPENDENT_AMBULATORY_CARE_PROVIDER_SITE_OTHER): Payer: BLUE CROSS/BLUE SHIELD | Admitting: Adult Health

## 2018-10-02 ENCOUNTER — Other Ambulatory Visit (HOSPITAL_COMMUNITY)
Admission: RE | Admit: 2018-10-02 | Discharge: 2018-10-02 | Disposition: A | Discharge status: Home / Self Care | Payer: BLUE CROSS/BLUE SHIELD | Source: Ambulatory Visit | Attending: Adult Health | Admitting: Adult Health

## 2018-10-02 ENCOUNTER — Encounter (INDEPENDENT_AMBULATORY_CARE_PROVIDER_SITE_OTHER): Payer: Self-pay

## 2018-10-02 ENCOUNTER — Encounter: Payer: Self-pay | Admitting: Adult Health

## 2018-10-02 VITALS — BP 107/68 | HR 85 | Ht 63.0 in | Wt 159.5 lb

## 2018-10-02 DIAGNOSIS — Z1321 Encounter for screening for nutritional disorder: Secondary | ICD-10-CM

## 2018-10-02 DIAGNOSIS — R5383 Other fatigue: Secondary | ICD-10-CM

## 2018-10-02 DIAGNOSIS — Z01419 Encounter for gynecological examination (general) (routine) without abnormal findings: Secondary | ICD-10-CM | POA: Diagnosis present

## 2018-10-02 DIAGNOSIS — Z13220 Encounter for screening for lipoid disorders: Secondary | ICD-10-CM

## 2018-10-02 DIAGNOSIS — R635 Abnormal weight gain: Secondary | ICD-10-CM

## 2018-10-02 DIAGNOSIS — R61 Generalized hyperhidrosis: Secondary | ICD-10-CM

## 2018-10-02 NOTE — Progress Notes (Signed)
Patient ID: Sharon Murillo, female   DOB: 1981/02/26, 37 y.o.   MRN: [id number] History of Present Illness: Sharon Murillo is a 37 year old white female, married, in for well woman gyn exam and pap.She is complaining of being tired and sweating, when out in sun.    Current Medications, Allergies, Past Medical History, Past Surgical History, Family History and Social History were reviewed in [employer] record.     Review of Systems:  Patient denies any headaches, hearing loss, blurred vision, shortness of breath, chest pain, abdominal pain, problems with bowel movements, urination, or intercourse. No joint pain or mood swings. +fatigue +increase sweating  Periods regular, and she uses condoms, or withdrawal as birth control  Can't lose weight and is exercising and eating good.  Physical Exam:BP 107/68 (BP Location: Left Arm, Patient Position: Sitting, Cuff Size: Normal)   Pulse 85   Ht 5\' 3"  (1.6 m)   Wt 159 lb 8 oz (72.3 kg)   LMP 09/11/2018 (Exact Date)   BMI 28.25 kg/m   Has gained 14 lbs in last 3.5 years General:  Well developed, well nourished, no acute distress Skin:  Warm and dry Neck:  Midline trachea, normal thyroid, good ROM, no lymphadenopathy Lungs; Clear to auscultation bilaterally Breast:  No dominant palpable mass, retraction, or nipple discharge Cardiovascular: Regular rate and rhythm Abdomen:  Soft, non tender, no hepatosplenomegaly Pelvic:  External genitalia is normal in appearance, no lesions.  The vagina is normal in appearance. Urethra has no lesions or masses. The cervix is bulbous.Pap with HPV performed.  Uterus is felt to be normal size, shape, and contour.  No adnexal masses or tenderness noted.Bladder is non tender, no masses felt. Extremities/musculoskeletal:  No swelling or varicosities noted, no clubbing or cyanosis Psych:  No mood changes, alert and cooperative,seems happy PHQ 2 score 0. Examination chaperoned by Estill Bamberg Rash  LPN.  Impression:  1. Encounter for gynecological examination with Papanicolaou smear of cervix   2. Sweat, sweating, excessive   3. Fatigue, unspecified type   4. Weight gain   5. Screening cholesterol level   6. Encounter for vitamin deficiency screening      Plan: Check CBC,CMP,TSH and lipids, and vitamin D, will talk when labs back  Physical in 1 year Pap in 3 if normal

## 2018-10-06 ENCOUNTER — Telehealth: Payer: Self-pay | Admitting: Adult Health

## 2018-10-06 LAB — TSH: TSH: 4.08 u[IU]/mL (ref 0.450–4.500)

## 2018-10-06 LAB — CBC
HEMATOCRIT: 41.7 % (ref 34.0–46.6)
HEMOGLOBIN: 14.1 g/dL (ref 11.1–15.9)
MCH: 31.1 pg (ref 26.6–33.0)
MCHC: 33.8 g/dL (ref 31.5–35.7)
MCV: 92 fL (ref 79–97)
Platelets: 221 10*3/uL (ref 150–450)
RBC: 4.54 x10E6/uL (ref 3.77–5.28)
RDW: 12.4 % (ref 12.3–15.4)
WBC: 5.5 10*3/uL (ref 3.4–10.8)

## 2018-10-06 LAB — LIPID PANEL
CHOLESTEROL TOTAL: 191 mg/dL (ref 100–199)
Chol/HDL Ratio: 3 ratio (ref 0.0–4.4)
HDL: 64 mg/dL (ref >39)
LDL Calculated: 116 mg/dL — ABNORMAL HIGH (ref 0–99)
TRIGLYCERIDES: 55 mg/dL (ref 0–149)
VLDL Cholesterol Cal: 11 mg/dL (ref 5–40)

## 2018-10-06 LAB — COMPREHENSIVE METABOLIC PANEL
ALBUMIN: 4.5 g/dL (ref 3.5–5.5)
ALK PHOS: 65 IU/L (ref 39–117)
ALT: 8 IU/L (ref 0–32)
AST: 15 IU/L (ref 0–40)
Albumin/Globulin Ratio: 2 (ref 1.2–2.2)
BILIRUBIN TOTAL: 0.7 mg/dL (ref 0.0–1.2)
BUN/Creatinine Ratio: 11 (ref 9–23)
BUN: 10 mg/dL (ref 6–20)
CHLORIDE: 100 mmol/L (ref 96–106)
CO2: 23 mmol/L (ref 20–29)
Calcium: 9.2 mg/dL (ref 8.7–10.2)
Creatinine, Ser: 0.89 mg/dL (ref 0.57–1.00)
GFR calc Af Amer: 97 mL/min/{1.73_m2} (ref >59)
GFR, EST NON AFRICAN AMERICAN: 84 mL/min/{1.73_m2} (ref >59)
GLOBULIN, TOTAL: 2.2 g/dL (ref 1.5–4.5)
Glucose: 98 mg/dL (ref 65–99)
POTASSIUM: 4.1 mmol/L (ref 3.5–5.2)
Sodium: 137 mmol/L (ref 134–144)
Total Protein: 6.7 g/dL (ref 6.0–8.5)

## 2018-10-06 LAB — CYTOLOGY - PAP
Diagnosis: NEGATIVE
HPV: NOT DETECTED

## 2018-10-06 LAB — VITAMIN D 25 HYDROXY (VIT D DEFICIENCY, FRACTURES): Vit D, 25-Hydroxy: 35.7 ng/mL (ref 30.0–100.0)

## 2018-10-06 NOTE — Telephone Encounter (Signed)
Left message with labs results and that they were good, keeping doing what you are doing.

## 2021-09-18 ENCOUNTER — Encounter: Payer: Self-pay | Admitting: Adult Health

## 2021-09-18 ENCOUNTER — Other Ambulatory Visit: Payer: Self-pay

## 2021-09-18 ENCOUNTER — Ambulatory Visit (INDEPENDENT_AMBULATORY_CARE_PROVIDER_SITE_OTHER): Payer: PRIVATE HEALTH INSURANCE | Admitting: Adult Health

## 2021-09-18 VITALS — BP 126/89 | HR 71 | Ht 62.0 in | Wt 144.0 lb

## 2021-09-18 DIAGNOSIS — N9489 Other specified conditions associated with female genital organs and menstrual cycle: Secondary | ICD-10-CM

## 2021-09-18 DIAGNOSIS — N921 Excessive and frequent menstruation with irregular cycle: Secondary | ICD-10-CM | POA: Diagnosis not present

## 2021-09-18 NOTE — Progress Notes (Signed)
  Subjective:     Patient ID: Sharon Murillo, female   DOB: September 26, 1981, 40 y.o.   MRN: [id number]  HPI Sharon Murillo is a 40 year old white female,married, [id number], in complaining of period cycle shorter and has irregular bleeding and has mild cramping and has had more clots.  Lab Results  Component Value Date   DIAGPAP  10/02/2018    NEGATIVE FOR INTRAEPITHELIAL LESIONS OR MALIGNANCY.   HPV NOT DETECTED 10/02/2018    Review of Systems Has irregular bleeding More clots Mild cramping,had not had in years Reviewed past medical,surgical, social and family history. Reviewed medications and allergies.     Objective:   Physical Exam BP 126/89 (BP Location: Left Arm, Patient Position: Sitting, Cuff Size: Normal)   Pulse 71   Ht '5\' 2"'$  (1.575 m)   Wt 144 lb (65.3 kg)   LMP 09/05/2021   BMI 26.34 kg/m   Skin warm and dry. Neck: mid line trachea, normal thyroid, good ROM, no lymphadenopathy noted. Lungs: clear to ausculation bilaterally. Cardiovascular: regular rate and rhythm.Marland Kitchen  [facility] - 09/18/21 1155       Pregnancy Intention Screening   Does the patient want to become pregnant in the next year? No    Does the patient's partner want to become pregnant in the next year? No    Would the patient like to discuss contraceptive options today? No      Contraception Wrap Up   Current Method Female Condom;Withdrawal or Other Method    End Method Female Condom;Withdrawal or Other Method    Contraception Counseling Provided No                Assessment:     1. Irregular intermenstrual bleeding Scheduled [place] at [facility] 09/22/21 at 12:30 pm, with fi\full bladder be there at 12:15 pm - US PELVIC COMPLETE WITH TRANSVAGINAL; Future Will talk when results back  2. Uterine cramping - US PELVIC COMPLETE WITH TRANSVAGINAL; Future     Plan:     Return to see me in 3 weeks for pap and physical

## 2021-09-22 ENCOUNTER — Ambulatory Visit (HOSPITAL_COMMUNITY)
Admission: RE | Admit: 2021-09-22 | Discharge: 2021-09-22 | Disposition: A | Discharge status: Home / Self Care | Payer: PRIVATE HEALTH INSURANCE | Source: Ambulatory Visit | Attending: Adult Health | Admitting: Adult Health

## 2021-09-22 ENCOUNTER — Other Ambulatory Visit: Payer: Self-pay

## 2021-09-22 DIAGNOSIS — N9489 Other specified conditions associated with female genital organs and menstrual cycle: Secondary | ICD-10-CM | POA: Diagnosis present

## 2021-09-22 DIAGNOSIS — N921 Excessive and frequent menstruation with irregular cycle: Secondary | ICD-10-CM

## 2021-10-09 ENCOUNTER — Other Ambulatory Visit (HOSPITAL_COMMUNITY)
Admission: RE | Admit: 2021-10-09 | Discharge: 2021-10-09 | Disposition: A | Discharge status: Home / Self Care | Payer: PRIVATE HEALTH INSURANCE | Source: Ambulatory Visit | Attending: Adult Health | Admitting: Adult Health

## 2021-10-09 ENCOUNTER — Encounter: Payer: Self-pay | Admitting: Adult Health

## 2021-10-09 ENCOUNTER — Other Ambulatory Visit: Payer: Self-pay

## 2021-10-09 ENCOUNTER — Ambulatory Visit (INDEPENDENT_AMBULATORY_CARE_PROVIDER_SITE_OTHER): Payer: PRIVATE HEALTH INSURANCE | Admitting: Adult Health

## 2021-10-09 VITALS — BP 113/66 | HR 71 | Ht 62.5 in | Wt 146.0 lb

## 2021-10-09 DIAGNOSIS — Z01419 Encounter for gynecological examination (general) (routine) without abnormal findings: Secondary | ICD-10-CM | POA: Diagnosis not present

## 2021-10-09 NOTE — Progress Notes (Signed)
Patient ID: Sharon Murillo, female   DOB: Jun 29, 1981, 40 y.o.   MRN: [id number] History of Present Illness:  Sharon Murillo is a 40 year old white female, married, [id number], in for a well woman gyn exam and pap. She had not had any more clots or BTB. No acute findings on [place] 09/22/21. She said her mom started menopause in late 38's.  Current Medications, Allergies, Past Medical History, Past Surgical History, Family History and Social History were reviewed in [employer] record.     Review of Systems: Patient denies any headaches, hearing loss, fatigue, blurred vision, shortness of breath, chest pain, abdominal pain, problems with bowel movements, urination, or intercourse. No joint pain or mood swings.  Denies any clots or BTB, since last appt. Denis any hot flashes or trouble sleeping.  Physical Exam:BP 113/66 (BP Location: Left Arm, Patient Position: Sitting, Cuff Size: Normal)   Pulse 71   Ht 5' 2.5" (1.588 m)   Wt 146 lb (66.2 kg)   LMP 09/25/2021 (Exact Date)   BMI 26.28 kg/m   General:  Well developed, well nourished, no acute distress Skin:  Warm and dry Neck:  Midline trachea, normal thyroid, good ROM, no lymphadenopathy Lungs; Clear to auscultation bilaterally Breast:  No dominant palpable mass, retraction, or nipple discharge Cardiovascular: Regular rate and rhythm Abdomen:  Soft, non tender, no hepatosplenomegaly Pelvic:  External genitalia is normal in appearance, no lesions.  The vagina is normal in appearance. Urethra has no lesions or masses. The cervix is bulbous, pap with HR HPV genotyping performed, cervix is friable with EC brush.  Uterus is felt to be normal size, shape, and contour.  No adnexal masses or tenderness noted.Bladder is non tender, no masses felt. Extremities/musculoskeletal:  No swelling or varicosities noted, no clubbing or cyanosis Psych:  No mood changes, alert and cooperative,seems happy AA is 0  Fall risk is low Depression  screen [facility] 2/9 10/09/2021 10/02/2018  Decreased Interest 0 0  Down, Depressed, Hopeless 0 0  PHQ - 2 Score 0 0  Altered sleeping 0 -  Tired, decreased energy 0 -  Change in appetite 0 -  Feeling bad or failure about yourself  0 -  Trouble concentrating 0 -  Moving slowly or fidgety/restless 0 -  Suicidal thoughts 0 -  PHQ-9 Score 0 -    GAD 7 : Generalized Anxiety Score 10/09/2021  Nervous, Anxious, on Edge 0  Control/stop worrying 0  Worry too much - different things 0  Trouble relaxing 0  Restless 0  Easily annoyed or irritable 0  Afraid - awful might happen 0  Total GAD 7 Score 0      [facility] - 10/09/21 1400       Pregnancy Intention Screening   Does the patient want to become pregnant in the next year? No    Does the patient's partner want to become pregnant in the next year? No    Would the patient like to discuss contraceptive options today? No      Contraception Wrap Up   Current Method Withdrawal or Other Method;Female Condom    End Method Withdrawal or Other Method;Female Condom    Contraception Counseling Provided No               Examination chaperoned by [employer].   Impression and Plan: 1. Encounter for gynecological examination with Papanicolaou smear of cervix Pap sent Physical in 1 year Pap in 3 if normal Mammogram at 42 She declines labs  Call if any BTB/clots return

## 2021-10-11 LAB — CYTOLOGY - PAP
Comment: NEGATIVE
Diagnosis: NEGATIVE
High risk HPV: NEGATIVE

## 2023-05-19 IMAGING — US US PELVIS COMPLETE WITH TRANSVAGINAL
1 series · 13 of 25 positions shown · non-contrast
Comparison: 04/15/2007

CLINICAL DATA: Irregular bleeding, cramping

EXAM:
TRANSABDOMINAL AND TRANSVAGINAL ULTRASOUND OF PELVIS
TECHNIQUE: Both transabdominal and transvaginal ultrasound examinations of the
pelvis were performed. Transabdominal technique was performed for
global imaging of the pelvis including uterus, ovaries, adnexal
regions, and pelvic cul-de-sac. It was necessary to proceed with
endovaginal exam following the transabdominal exam to visualize the
endometrium and ovaries.

[Series 1: us pelvic complete with transvaginal · 13 of 123 slices shown]
[im 1/123]
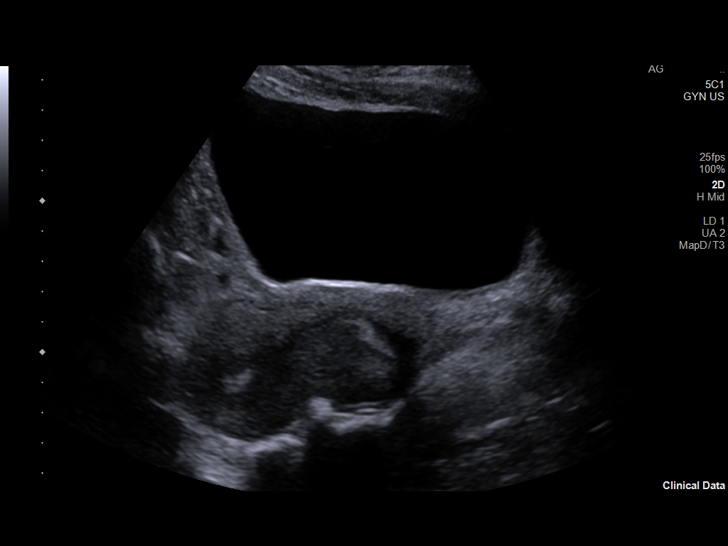
[im 11/123]
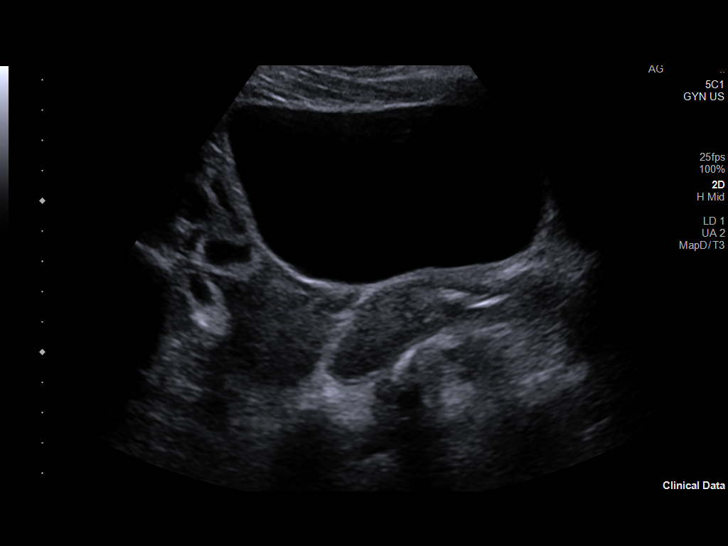
[im 21/123]
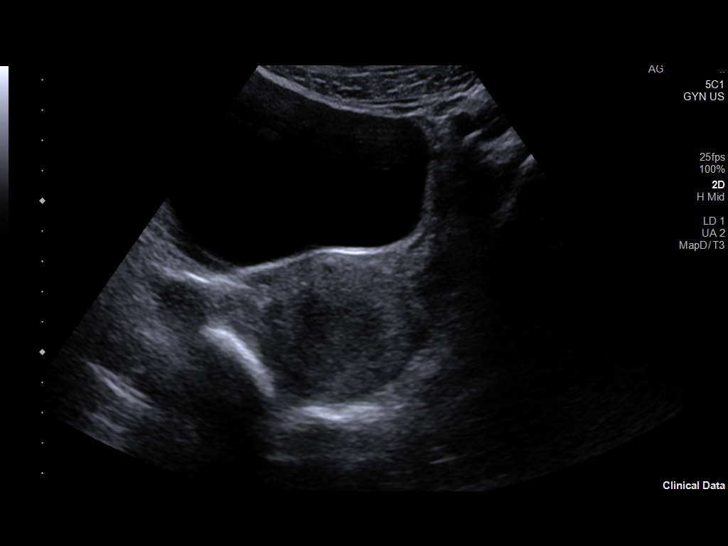
[im 31/123]
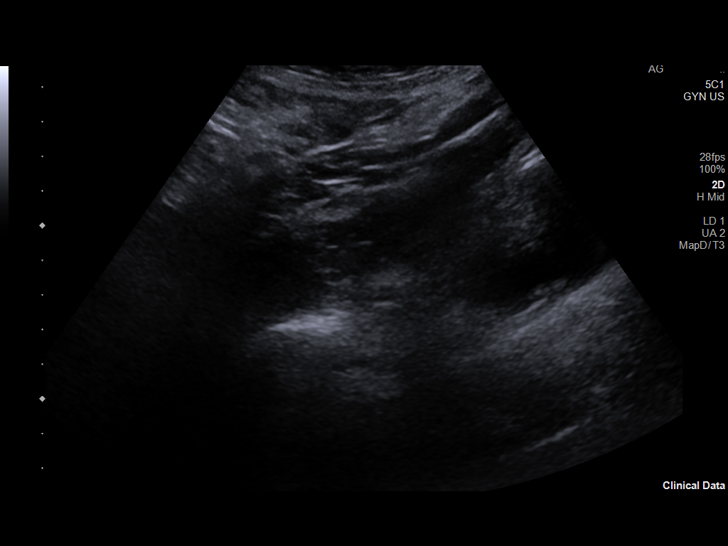
[im 41/123]
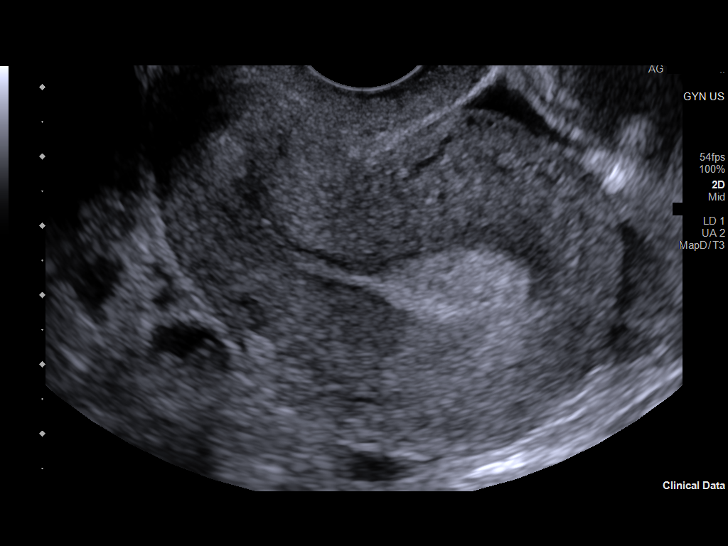
[im 51/123]
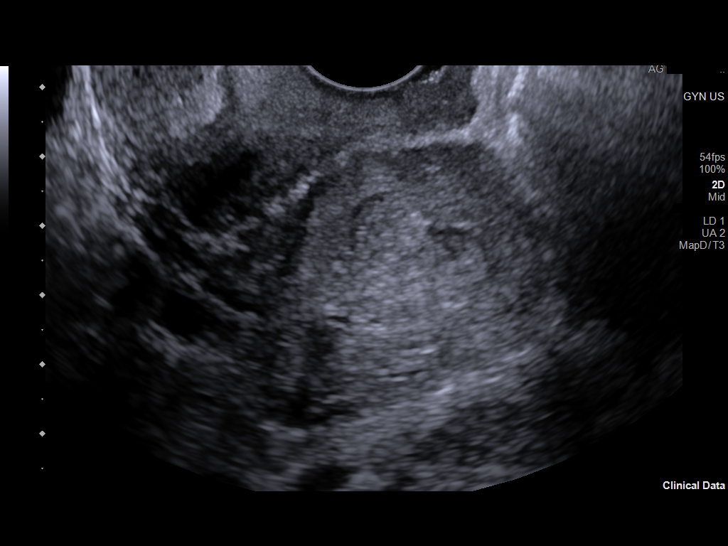
[im 62/123]
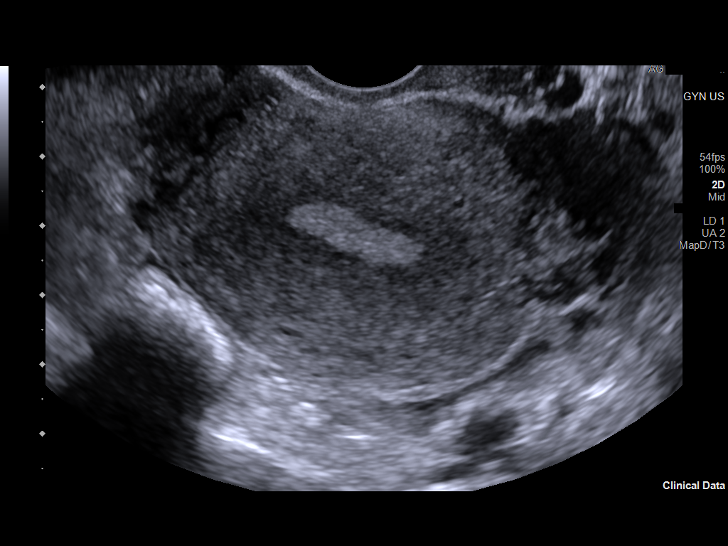
[im 72/123]
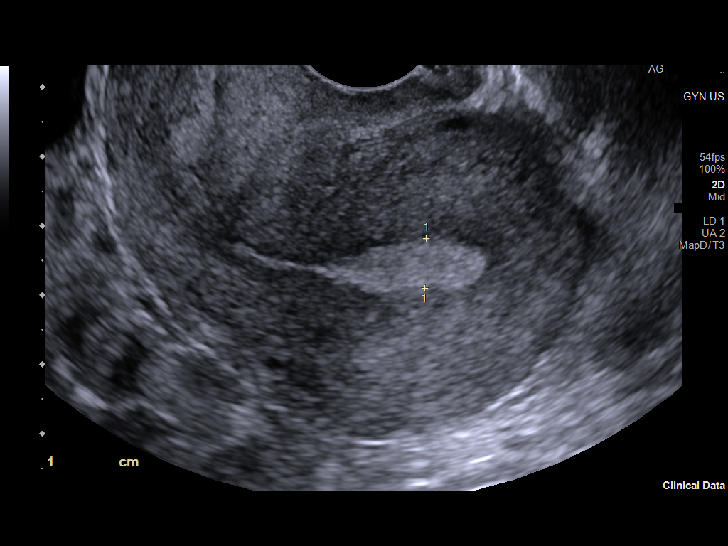
[im 82/123]
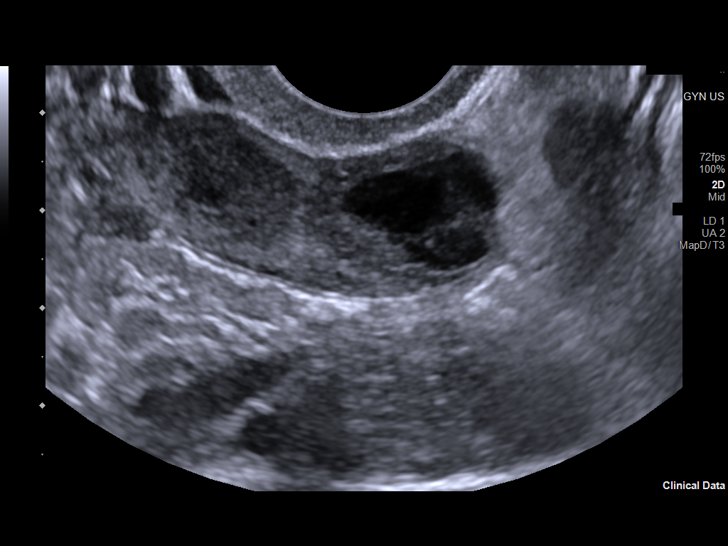
[im 92/123]
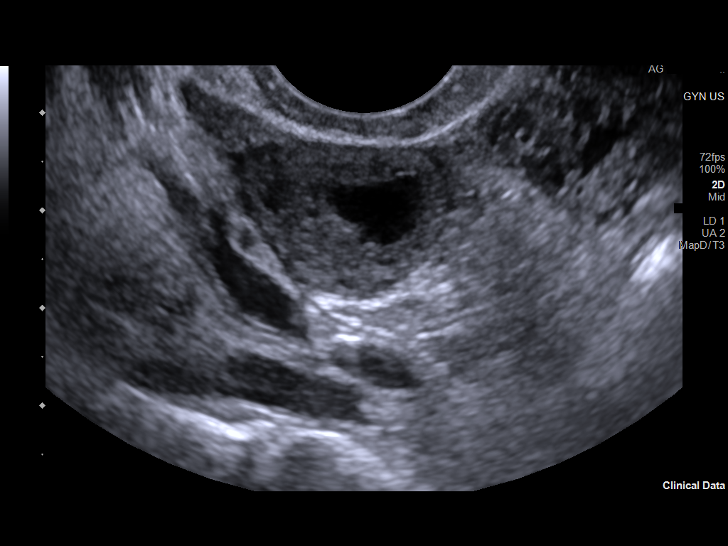
[im 102/123]
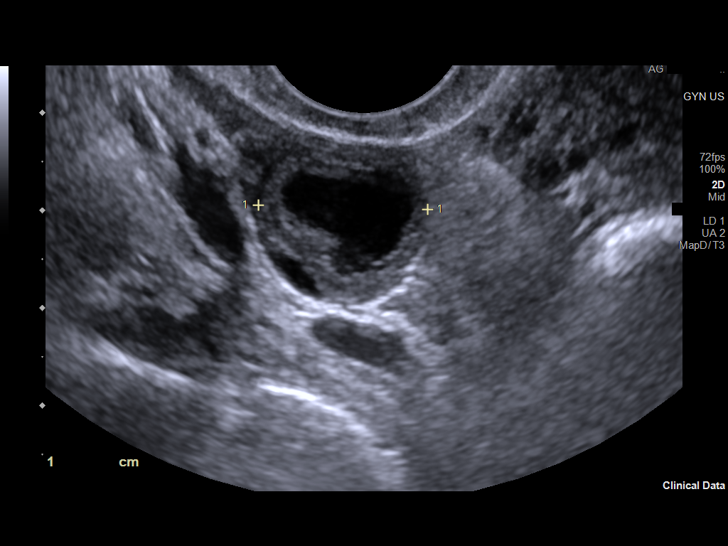
[im 112/123]
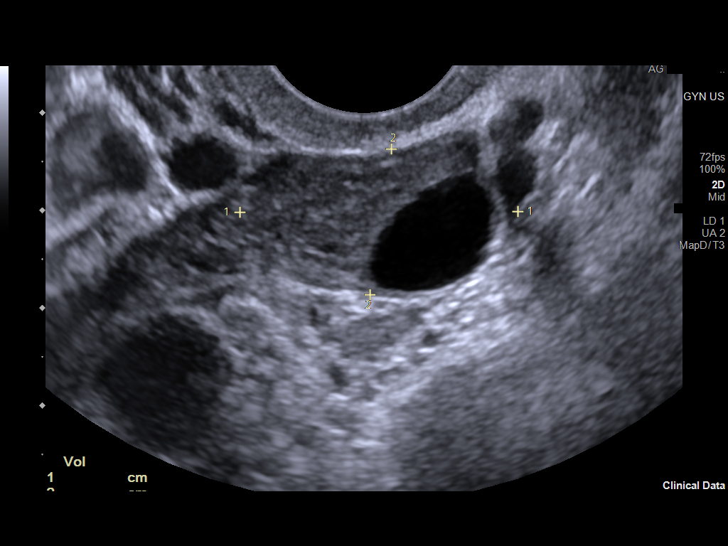
[im 123/123]
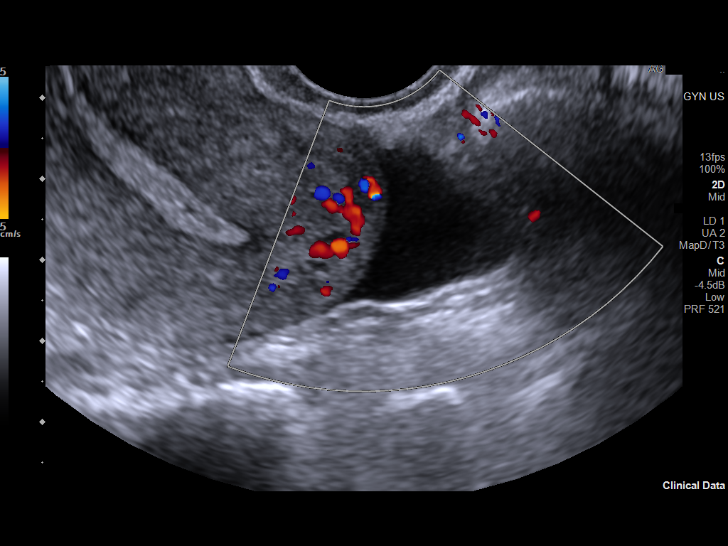

[13 of 25 positions shown; findings below may reference images not displayed]

FINDINGS: Uterus

Measurements: 7.8 x 4.9 x 5.2 cm = volume: 105 mL. No fibroids or
other mass visualized.

Endometrium

Thickness: 7 mm in thickness.  No focal abnormality visualized.

Right ovary

Measurements: 3.8 x 1.6 x 2.2 cm = volume: 6.7 mL. Small follicles
measuring up to 2 cm.

Left ovary

Measurements: 2.9 x 1.5 x 1.9 cm = volume: 4.2 mL. Normal
appearance/no adnexal mass.

Other findings

Moderate free fluid in the pelvis.
IMPRESSION: No acute findings or significant abnormality. If bleeding remains
unresponsive to hormonal or medical therapy, sonohysterogram should
be considered for focal lesion work-up. (Ref: Radiological
Reasoning: Algorithmic Workup of Abnormal Vaginal Bleeding with
Endovaginal Sonography and Sonohysterography. AJR [id number]; [id number])

## 2024-03-11 ENCOUNTER — Ambulatory Visit: Payer: PRIVATE HEALTH INSURANCE | Admitting: Women's Health

## 2024-03-12 ENCOUNTER — Encounter: Payer: Self-pay | Admitting: Women's Health

## 2024-03-12 ENCOUNTER — Other Ambulatory Visit (HOSPITAL_COMMUNITY)
Admission: RE | Admit: 2024-03-12 | Discharge: 2024-03-12 | Disposition: A | Discharge status: Home / Self Care | Payer: PRIVATE HEALTH INSURANCE | Source: Ambulatory Visit | Attending: Women's Health | Admitting: Women's Health

## 2024-03-12 ENCOUNTER — Ambulatory Visit (INDEPENDENT_AMBULATORY_CARE_PROVIDER_SITE_OTHER): Payer: PRIVATE HEALTH INSURANCE | Admitting: Women's Health

## 2024-03-12 VITALS — BP 113/73 | HR 68 | Ht 62.2 in | Wt 149.2 lb

## 2024-03-12 DIAGNOSIS — R87611 Atypical squamous cells cannot exclude high grade squamous intraepithelial lesion on cytologic smear of cervix (ASC-H): Secondary | ICD-10-CM | POA: Insufficient documentation

## 2024-03-12 DIAGNOSIS — Z3202 Encounter for pregnancy test, result negative: Secondary | ICD-10-CM

## 2024-03-12 DIAGNOSIS — Z3009 Encounter for other general counseling and advice on contraception: Secondary | ICD-10-CM

## 2024-03-12 DIAGNOSIS — R87619 Unspecified abnormal cytological findings in specimens from cervix uteri: Secondary | ICD-10-CM | POA: Insufficient documentation

## 2024-03-12 LAB — POCT URINE PREGNANCY: Preg Test, Ur: NEGATIVE

## 2024-03-12 NOTE — Patient Instructions (Signed)
 Colposcopy, Care After  The following information offers guidance on how to care for yourself after your procedure. Your health care provider may also give you more specific instructions. If you have problems or questions, contact your health care provider. What can I expect after the procedure? If you had a colposcopy without a biopsy, you can expect to feel fine right away after your procedure. However, you may have some spotting of blood for a few days. You can return to your normal activities. If you had a colposcopy with a biopsy, it is common after the procedure to have: Soreness and mild pain. These may last for a few days. Mild vaginal bleeding or discharge that is dark-colored and grainy. This may last for a few days. The discharge may be caused by a liquid (solution) that was used during the procedure. You may need to wear a sanitary pad during this time. Spotting of blood for at least 48 hours after the procedure. Follow these instructions at home: Medicines Take over-the-counter and prescription medicines only as told by your health care provider. Talk with your health care provider about what type of over-the-counter pain medicines and prescription medicines you can start to take again. It is especially important to talk with your health care provider if you take blood thinners. Activity Avoid using douche products, using tampons, and having sex for at least 3 days after the procedure or for as long as told by your health care provider. Return to your normal activities as told by your health care provider. Ask your health care provider what activities are safe for you. General instructions Ask your health care provider if you may take baths, swim, or use a hot tub. You may take showers. If you use birth control (contraception), continue to use it. Keep all follow-up visits. This is important. Contact a health care provider if: You have a fever or chills. You faint or feel  light-headed. Get help right away if: You have heavy bleeding from your vagina or pass blood clots. Heavy bleeding is bleeding that soaks through a sanitary pad in less than 1 hour. You have vaginal discharge that is abnormal, is yellow in color, or smells bad. This could be a sign of infection. You have severe pain or cramps in your lower abdomen that do not go away with medicine. Summary If you had a colposcopy without a biopsy, you can expect to feel fine right away, but you may have some spotting of blood for a few days. You can return to your normal activities. If you had a colposcopy with a biopsy, it is common to have mild pain for a few days and spotting for 48 hours after the procedure. Avoid using douche products, using tampons, and having sex for at least 3 days after the procedure or for as long as told by your health care provider. Get help right away if you have heavy bleeding, severe pain, or signs of infection. This information is not intended to replace advice given to you by your health care provider. Make sure you discuss any questions you have with your health care provider. Document Revised: 05/14/2021 Document Reviewed: 05/14/2021 Elsevier Patient Education  2024 [employer].

## 2024-03-12 NOTE — Progress Notes (Signed)
   COLPOSCOPY PROCEDURE NOTE Patient name: Sharon Murillo MRN [id number]  Date of birth: 1981-01-31 Subjective Findings:   Sharon Murillo is a 43 y.o. G24P2012 Caucasian female being seen today for a colposcopy. Indication: Abnormal pap on 02/26/24: ASC-H w/ HRHPV negative  Prior cytology:  Date Result Procedure  2022 NILM w/ HRHPV negative None  2019 NILM w/ HRHPV negative None          Patient's last menstrual period was 02/07/2024. Contraception: none, partner has to take testosterone, not sure he is fertile. Does not want another baby. Is interested in BTS  Menopausal: no. Hysterectomy: no.   Considering pregnancy: No New sex partner: yes Smoker: no. Immunocompromised: no.   The risks and benefits were explained and informed consent was obtained, and written copy is in chart. Pertinent History Reviewed:   Reviewed past medical,surgical, social, obstetrical and family history.  Reviewed problem list, medications and allergies. Objective Findings & Procedure:   Vitals:   03/12/24 1123  BP: 113/73  Pulse: 68  Weight: 149 lb 3.2 oz (67.7 kg)  Height: 5' 2.2" (1.58 m)  Body mass index is 27.11 kg/m.  No results found for this or any previous visit (from the past 24 hours).   Time out was performed.  Speculum placed in the vagina, cervix fully visualized. SCJ: fully visualized. Cervix beefy red, slightly friable- says had recent neg STD screen w/ PCP; Cervix swabbed x 3 with acetic acid.  Acetowhitening present: No Cervix: no visible lesions, no mosaicism, no punctation, and no abnormal vasculature. Cervical random biopsies taken at 10 & 4 o'clock and Hemostasis achieved with Monsel's solution. Vagina: vaginal colposcopy not performed Vulva: vulvar colposcopy not performed  Specimens: 2  Complications: none  Chaperone: Peggy Dones  Colposcopic Impression & Plan:   No lesions, cx beefy red and slightly friable 2 biopsies taken Interested in BTS- discussed, wants to  make appt w/ MD to discuss further/schedule Plan: Post biopsy instructions given, Will notify patient of results when back, and Will base plan of care on pathology results and ASCCP guidelines  Return in about 1 year (around 03/12/2025) for Pap & physical; wants appt w/ MD to discuss/schedule BTL.  Cheral Marker CNM, [facility] 03/12/2024 12:12 PM

## 2024-03-12 NOTE — Addendum Note (Signed)
 Addended by: Federico Flake A on: 03/12/2024 12:56 PM   Modules accepted: Orders

## 2024-03-12 NOTE — Addendum Note (Signed)
 Addended by: Federico Flake A on: 03/12/2024 12:48 PM   Modules accepted: Orders

## 2024-03-16 LAB — SURGICAL PATHOLOGY

## 2024-03-18 ENCOUNTER — Encounter: Payer: Self-pay | Admitting: Women's Health

## 2024-03-26 ENCOUNTER — Ambulatory Visit: Payer: PRIVATE HEALTH INSURANCE | Admitting: Obstetrics & Gynecology

## 2024-03-26 ENCOUNTER — Encounter: Payer: Self-pay | Admitting: Obstetrics & Gynecology

## 2024-03-26 VITALS — BP 121/84 | HR 72

## 2024-03-26 DIAGNOSIS — N920 Excessive and frequent menstruation with regular cycle: Secondary | ICD-10-CM

## 2024-03-26 DIAGNOSIS — N870 Mild cervical dysplasia: Secondary | ICD-10-CM

## 2024-03-26 DIAGNOSIS — Z3009 Encounter for other general counseling and advice on contraception: Secondary | ICD-10-CM

## 2024-03-26 NOTE — Progress Notes (Signed)
 Follow up appointment  Encounter sterilization  Chief Complaint  Patient presents with   Contraception    Discuss BTL    Blood pressure 121/84, pulse 72, last menstrual period 02/07/2024.  Wants to avoid hormone based BCM Does not want IUD Does have post coital and intermenstrual spotting Colpo revelaed LSIL and cervix is beefy red which could explain the source   [id number]   MEDS ordered this encounter: No orders of the defined types were placed in this encounter.   Orders for this encounter: No orders of the defined types were placed in this encounter.   Impression + Management Plan   ICD-10-CM   1. Encounter for consultation for female sterilization  Z30.09     2. Dysplasia of cervix, low grade (CIN 1)  N87.0     3. Intermenstrual spotting  N92.0       Follow Up: Return in about 8 weeks (around 05/22/2024) for Post Op, with Dr Despina Hidden.     All questions were answered.  Past Medical History:  Diagnosis Date   Cancer [facility])    skin   Contraceptive management 03/10/2014   Irregular intermenstrual bleeding 03/24/2015    Past Surgical History:  Procedure Laterality Date   SKIN CANCER EXCISION     TONSILLECTOMY      OB History     Gravida  3   Para  2   Term  2   Preterm      AB  1   Living  2      SAB  1   IAB      Ectopic      Multiple      Live Births  2           No Known Allergies  Social History   Socioeconomic History   Marital status: Married    Spouse name: Not on file   Number of children: Not on file   Years of education: Not on file   Highest education level: Not on file  Occupational History   Not on file  Tobacco Use   Smoking status: Never   Smokeless tobacco: Never  Vaping Use   Vaping status: Never Used  Substance and Sexual Activity   Alcohol use: No   Drug use: No   Sexual activity: Yes    Birth control/protection: Condom, Coitus interruptus  Other Topics Concern   Not on file  Social History  Narrative   Not on file   Social Drivers of Health   Financial Resource Strain: Low Risk  (03/12/2024)   Overall Financial Resource Strain (CARDIA)    Difficulty of Paying Living Expenses: Not hard at all  Food Insecurity: No Food Insecurity (03/12/2024)   Hunger Vital Sign    Worried About Running Out of Food in the Last Year: Never true    Ran Out of Food in the Last Year: Never true  Transportation Needs: No Transportation Needs (03/12/2024)   PRAPARE - Administrator, Civil Service (Medical): No    Lack of Transportation (Non-Medical): No  Physical Activity: Sufficiently Active (03/12/2024)   Exercise Vital Sign    Days of Exercise per Week: 5 days    Minutes of Exercise per Session: 30 min  Stress: Stress Concern Present (03/12/2024)   [employer] of Occupational Health - Occupational Stress Questionnaire    Feeling of Stress : To some extent  Social Connections: Moderately Isolated (03/12/2024)   Social Connection and Isolation Panel [NHANES]  Frequency of Communication with Friends and Family: Once a week    Frequency of Social Gatherings with Friends and Family: Once a week    Attends Religious Services: More than 4 times per year    Active Member of [employer] or Organizations: No    Attends Engineer, structural: Never    Marital Status: Married    Family History  Problem Relation Age of Onset   Heart disease Paternal Grandfather    Cancer Paternal Grandfather        lung   Diabetes Paternal Grandmother    Stroke Maternal Grandmother        mini   Cancer Maternal Grandfather        skin   Heart disease Maternal Grandfather    Diabetes Father    Hypertension Father    Sleep apnea Father    Osteoporosis Mother    Other Neg Hx

## 2024-03-30 ENCOUNTER — Encounter: Payer: Self-pay | Admitting: Obstetrics & Gynecology

## 2024-05-08 NOTE — Patient Instructions (Signed)
 Sharon Murillo  05/08/2024     @PREFPERIOPPHARMACY @   Your procedure is scheduled on  05/13/2024.   Report to [facility] at  0900  A.M.    Call this number if you have problems the morning of surgery:  [phone]  If you experience any cold or flu symptoms such as cough, fever, chills, shortness of breath, etc. between now and your scheduled surgery, please notify us  at the above number.   Remember:  Do not eat after midnight.   You may drink clear liquids until  0700 am on 05/13/2024.    Clear liquids allowed are:                    Water, Juice (No red color; non-citric and without pulp; diabetics please choose diet or no sugar options), Carbonated beverages (diabetics please choose diet or no sugar options), Clear Tea (No creamer, milk, or cream, including half & half and powdered creamer), Black Coffee Only (No creamer, milk or cream, including half & half and powdered creamer), and Clear Sports drink (No red color; diabetics please choose diet or no sugar options)        At 0700 am on 05/13/2024 drink your carb drink. You can have nothing else to drink after this.    Take these medicines the morning of surgery with A SIP OF WATER                                                    None.    Do not wear jewelry, make-up or nail polish, including gel polish,  artificial nails, or any other type of covering on natural nails (fingers and  toes).  Do not wear lotions, powders, or perfumes, or deodorant.  Do not shave 48 hours prior to surgery.  Men may shave face and neck.  Do not bring valuables to the hospital.  [facility] is not responsible for any belongings or valuables.  Contacts, dentures or bridgework may not be worn into surgery.  Leave your suitcase in the car.  After surgery it may be brought to your room.  For patients admitted to the hospital, discharge time will be determined by your treatment team.  Patients discharged the day of surgery will not  be allowed to drive home and must have someone with them for 24 hours.    Special instructions:   DO NOT smoke tobacco or vape for 24 hours before your procedure.  Please read over the following fact sheets that you were given. Coughing and Deep Breathing, Surgical Site Infection Prevention, Anesthesia Post-op Instructions, and Care and Recovery After Surgery       Cervical Laser Surgery, Care After After cervical laser surgery, it is common to have: Pain or discomfort. Mild cramping. Bleeding, spotting, or brownish discharge from your vagina. Follow these instructions at home: Activity  Rest as told by your health care provider. Return to your normal activities as told by you provider. Ask your provider what activities are safe for you. Do not have sex until your provider says it is okay. General instructions Take over-the-counter and prescription medicines only as told by your provider. Ask your provider if the medicine prescribed to you requires you to avoid driving or using machinery. Wear menstrual pads to absorb any bleeding,  spotting, and discharge. Do not put anything into your vagina, including tampons or douche, until your provider says it is okay. It is up to you to get the results of your procedure. Ask your provider, or the department that is doing the procedure, when your results will be ready. Your provider may give you more instructions. Make sure you know what you can and cannot do. Contact a health care provider if: Your pain or cramping does not improve. Your periods are more painful than usual. You do not get your period as expected. Get help right away if: You have any symptoms of infection, such as: A fever. Chills. Discharge that smells bad. You have severe pain in your lower abdomen. You have heavy bleeding from your vagina. A sign of heavy bleeding is that you need to use more than one pad per hour. You have vaginal bleeding with clumps of blood (blood  clots). This information is not intended to replace advice given to you by your health care provider. Make sure you discuss any questions you have with your health care provider. Document Revised: 08/28/2022 Document Reviewed: 08/28/2022 Elsevier Patient Education  2024 Elsevier Inc.  Surgery to Take Out One or Both Fallopian Tubes (Salpingectomy): What to Know After After one or both of your fallopian tubes are taken out, you may have pain in your belly and light bleeding from your vagina for a few days. You may also feel tired for a few days. Your recovery time will depend on the method that was used in your surgery. Follow these instructions at home: Medicines Take your medicines only as told. You may need to take steps to help treat or prevent trouble pooping (constipation), such as: Taking medicines to help you poop. Eating foods high in fiber, like beans, whole grains, and fresh fruits and vegetables. Drinking more fluids as told. Ask your health care provider if it's safe to drive or use machines while taking your medicine. Caring for your cuts from surgery  Take care of the cuts in your belly as told. Make sure you: Wash your hands with soap and water for at least 20 seconds before and after you change your bandage. If you can't use soap and water, use hand sanitizer. Change your bandage. Leave stitches, staples, or skin glue alone. Leave tape strips alone unless you're told to take them off. You may trim the edges of the tape strips if they curl up. Check the cuts on your belly every day for signs of infection. Check for: More redness, swelling, or pain. More fluid or blood. Warmth. Pus or a bad smell. Activity Rest as told. Get up to take short walks at least every 2 hours during the day. This helps you breathe better and keeps your blood flowing. Ask for help if you feel weak or unsteady. Do not lift anything heavier than 10 lb (4.5 kg) until you're told it's OK. Do not  take baths, swim, or use a hot tub until you're told it's OK. Ask if you can shower. Ask what things are safe for you to do at home. Ask when you can go back to work or school. General instructions Do not smoke, vape, or use nicotine or tobacco. Wear compression stockings to reduce swelling and help prevent blood clots in your legs. Your provider may give you more instructions. Make sure you know what you can and can't do. Contact a health care provider if: You have pain when you pee. You have symptoms of  infection in the cuts in your belly. You have a fever. You have pain in your belly that gets worse or does not get better with medicine. You have a rash. You feel light-headed. You throw up or feel like throwing up. Get help right away if: You have pain in your chest or leg. You have shortness of breath. You faint. You have more bleeding from your vagina that soaks one pad in an hour. These symptoms may be an emergency. Call 911 right away. Do not wait to see if the symptoms will go away. Do not drive yourself to the hospital. This information is not intended to replace advice given to you by your health care provider. Make sure you discuss any questions you have with your health care provider. Document Revised: 07/29/2023 Document Reviewed: 07/29/2023 Elsevier Patient Education  2024 Elsevier Inc.General Anesthesia, Adult, Care After The following information offers guidance on how to care for yourself after your procedure. Your health care provider may also give you more specific instructions. If you have problems or questions, contact your health care provider. What can I expect after the procedure? After the procedure, it is common for people to: Have pain or discomfort at the IV site. Have nausea or vomiting. Have a sore throat or hoarseness. Have trouble concentrating. Feel cold or chills. Feel weak, sleepy, or tired (fatigue). Have soreness and body aches. These can affect  parts of the body that were not involved in surgery. Follow these instructions at home: For the time period you were told by your health care provider:  Rest. Do not participate in activities where you could fall or become injured. Do not drive or use machinery. Do not drink alcohol. Do not take sleeping pills or medicines that cause drowsiness. Do not make important decisions or sign legal documents. Do not take care of children on your own. General instructions Drink enough fluid to keep your urine pale yellow. If you have sleep apnea, surgery and certain medicines can increase your risk for breathing problems. Follow instructions from your health care provider about wearing your sleep device: Anytime you are sleeping, including during daytime naps. While taking prescription pain medicines, sleeping medicines, or medicines that make you drowsy. Return to your normal activities as told by your health care provider. Ask your health care provider what activities are safe for you. Take over-the-counter and prescription medicines only as told by your health care provider. Do not use any products that contain nicotine or tobacco. These products include cigarettes, chewing tobacco, and vaping devices, such as e-cigarettes. These can delay incision healing after surgery. If you need help quitting, ask your health care provider. Contact a health care provider if: You have nausea or vomiting that does not get better with medicine. You vomit every time you eat or drink. You have pain that does not get better with medicine. You cannot urinate or have bloody urine. You develop a skin rash. You have a fever. Get help right away if: You have trouble breathing. You have chest pain. You vomit blood. These symptoms may be an emergency. Get help right away. Call 911. Do not wait to see if the symptoms will go away. Do not drive yourself to the hospital. Summary After the procedure, it is common to  have a sore throat, hoarseness, nausea, vomiting, or to feel weak, sleepy, or fatigue. For the time period you were told by your health care provider, do not drive or use machinery. Get help right away if  you have difficulty breathing, have chest pain, or vomit blood. These symptoms may be an emergency. This information is not intended to replace advice given to you by your health care provider. Make sure you discuss any questions you have with your health care provider. Document Revised: 03/16/2022 Document Reviewed: 03/16/2022 Elsevier Patient Education  2024 Elsevier Inc.How to Use Chlorhexidine at Home in the Shower Chlorhexidine gluconate (CHG) is a germ-killing (antiseptic) wash that's used to clean the skin. It can get rid of the germs that normally live on the skin and can keep them away for about 24 hours. If you're having surgery, you may be told to shower with CHG at home the night before surgery. This can help lower your risk for infection. To use CHG wash in the shower, follow the steps below. Supplies needed: CHG body wash. Clean washcloth. Clean towel. How to use CHG in the shower Follow these steps unless you're told to use CHG in a different way: Start the shower. Use your normal soap and shampoo to wash your face and hair. Turn off the shower or move out of the shower stream. Pour CHG onto a clean washcloth. Do not use any type of brush or rough sponge. Start at your neck, washing your body down to your toes. Make sure you: Wash the part of your body where the surgery will be done for at least 1 minute. Do not scrub. Do not use CHG on your head or face unless your health care provider tells you to. If it gets into your ears or eyes, rinse them well with water. Do not wash your genitals with CHG. Wash your back and under your arms. Make sure to wash skin folds. Let the CHG sit on your skin for 1-2 minutes or as long as told. Rinse your entire body in the shower, including  all body creases and folds. Turn off the shower. Dry off with a clean towel. Do not put anything on your skin afterward, such as powder, lotion, or perfume. Put on clean clothes or pajamas. If it's the night before surgery, sleep in clean sheets. General tips Use CHG only as told, and follow the instructions on the label. Use the full amount of CHG as told. This is often one bottle. Do not smoke and stay away from flames after using CHG. Your skin may feel sticky after using CHG. This is normal. The sticky feeling will go away as the CHG dries. Do not use CHG: If you have a chlorhexidine allergy or have reacted to chlorhexidine in the past. On open wounds or areas of skin that have broken skin, cuts, or scrapes. On babies younger than 44 months of age. Contact a health care provider if: You have questions about using CHG. Your skin gets irritated or itchy. You have a rash after using CHG. You swallow any CHG. Call your local poison control center [phone] in the [place] Your eyes itch badly, or they become very red or swollen. Your hearing changes. You have trouble seeing. If you can't reach your provider, go to an urgent care or emergency room. Do not drive yourself. Get help right away if: You have swelling or tingling in your mouth or throat. You make high-pitched whistling sounds when you breathe, most often when you breathe out (wheeze). You have trouble breathing. These symptoms may be an emergency. Call 911 right away. Do not wait to see if the symptoms will go away. Do not drive yourself to the hospital. This  information is not intended to replace advice given to you by your health care provider. Make sure you discuss any questions you have with your health care provider. Document Revised: 07/02/2023 Document Reviewed: 06/28/2022 Elsevier Patient Education  2024 [employer].

## 2024-05-10 ENCOUNTER — Other Ambulatory Visit: Payer: Self-pay | Admitting: Obstetrics & Gynecology

## 2024-05-10 DIAGNOSIS — Z01818 Encounter for other preprocedural examination: Secondary | ICD-10-CM

## 2024-05-11 ENCOUNTER — Encounter (HOSPITAL_COMMUNITY): Payer: Self-pay

## 2024-05-11 ENCOUNTER — Other Ambulatory Visit: Payer: Self-pay

## 2024-05-11 ENCOUNTER — Encounter (HOSPITAL_COMMUNITY)
Admission: RE | Admit: 2024-05-11 | Discharge: 2024-05-11 | Disposition: A | Discharge status: Home / Self Care | Payer: PRIVATE HEALTH INSURANCE | Source: Ambulatory Visit | Attending: Obstetrics & Gynecology | Admitting: Obstetrics & Gynecology

## 2024-05-11 DIAGNOSIS — Z01812 Encounter for preprocedural laboratory examination: Secondary | ICD-10-CM | POA: Insufficient documentation

## 2024-05-11 DIAGNOSIS — Z01818 Encounter for other preprocedural examination: Secondary | ICD-10-CM

## 2024-05-11 LAB — CBC
HCT: 39.6 % (ref 36.0–46.0)
Hemoglobin: 13.7 g/dL (ref 12.0–15.0)
MCH: 31.4 pg (ref 26.0–34.0)
MCHC: 34.6 g/dL (ref 30.0–36.0)
MCV: 90.8 fL (ref 80.0–100.0)
Platelets: 198 10*3/uL (ref 150–400)
RBC: 4.36 MIL/uL (ref 3.87–5.11)
RDW: 12.1 % (ref 11.5–15.5)
WBC: 6.3 10*3/uL (ref 4.0–10.5)
nRBC: 0 % (ref 0.0–0.2)

## 2024-05-11 LAB — COMPREHENSIVE METABOLIC PANEL WITH GFR
ALT: 14 U/L (ref 0–44)
AST: 19 U/L (ref 15–41)
Albumin: 4.3 g/dL (ref 3.5–5.0)
Alkaline Phosphatase: 48 U/L (ref 38–126)
Anion gap: 9 (ref 5–15)
BUN: 10 mg/dL (ref 6–20)
CO2: 25 mmol/L (ref 22–32)
Calcium: 8.8 mg/dL — ABNORMAL LOW (ref 8.9–10.3)
Chloride: 101 mmol/L (ref 98–111)
Creatinine, Ser: 0.8 mg/dL (ref 0.44–1.00)
GFR, Estimated: >60 mL/min (ref >60)
Glucose, Bld: 98 mg/dL (ref 70–99)
Potassium: 3.5 mmol/L (ref 3.5–5.1)
Sodium: 135 mmol/L (ref 135–145)
Total Bilirubin: 0.7 mg/dL (ref 0.0–1.2)
Total Protein: 7.4 g/dL (ref 6.5–8.1)

## 2024-05-11 LAB — RAPID HIV SCREEN (HIV 1/2 AB+AG)
HIV 1/2 Antibodies: NONREACTIVE
HIV-1 P24 Antigen - HIV24: NONREACTIVE

## 2024-05-11 LAB — TYPE AND SCREEN
ABO/RH(D): O POS
Antibody Screen: NEGATIVE

## 2024-05-11 LAB — PREGNANCY, URINE: Preg Test, Ur: NEGATIVE

## 2024-05-12 NOTE — Progress Notes (Signed)
 Pt notified surgery canceled for tomorrow due to no water in hospital. Instructed to call Dr Deneen Finical office to reschedule. Voiced understanding.

## 2024-05-14 ENCOUNTER — Encounter: Payer: Self-pay | Admitting: Obstetrics & Gynecology

## 2024-05-21 ENCOUNTER — Encounter: Payer: PRIVATE HEALTH INSURANCE | Admitting: Obstetrics & Gynecology

## 2024-06-14 ENCOUNTER — Other Ambulatory Visit: Payer: Self-pay | Admitting: Obstetrics & Gynecology

## 2024-06-15 ENCOUNTER — Encounter (HOSPITAL_COMMUNITY): Payer: Self-pay

## 2024-06-15 ENCOUNTER — Other Ambulatory Visit: Payer: Self-pay

## 2024-06-15 ENCOUNTER — Encounter (HOSPITAL_COMMUNITY)
Admission: RE | Admit: 2024-06-15 | Discharge: 2024-06-15 | Disposition: A | Discharge status: Home / Self Care | Payer: PRIVATE HEALTH INSURANCE | Source: Ambulatory Visit | Attending: Obstetrics & Gynecology | Admitting: Obstetrics & Gynecology

## 2024-06-15 ENCOUNTER — Other Ambulatory Visit: Payer: Self-pay | Admitting: Obstetrics & Gynecology

## 2024-06-15 ENCOUNTER — Other Ambulatory Visit (HOSPITAL_COMMUNITY)
Admission: RE | Admit: 2024-06-15 | Discharge: 2024-06-15 | Disposition: A | Discharge status: Home / Self Care | Payer: PRIVATE HEALTH INSURANCE | Source: Ambulatory Visit | Attending: Obstetrics & Gynecology | Admitting: Obstetrics & Gynecology

## 2024-06-15 VITALS — Ht 62.0 in | Wt 149.3 lb

## 2024-06-15 DIAGNOSIS — Z01818 Encounter for other preprocedural examination: Secondary | ICD-10-CM | POA: Diagnosis present

## 2024-06-15 LAB — PREGNANCY, URINE: Preg Test, Ur: NEGATIVE

## 2024-06-15 NOTE — Pre-Procedure Instructions (Signed)
 Dr Randolm Butte messaged to make sure he did not want another type and screen and he did not.

## 2024-06-15 NOTE — Progress Notes (Signed)
 Rx for urine pregnancy

## 2024-06-17 ENCOUNTER — Ambulatory Visit (HOSPITAL_BASED_OUTPATIENT_CLINIC_OR_DEPARTMENT_OTHER): Payer: PRIVATE HEALTH INSURANCE | Admitting: Certified Registered"

## 2024-06-17 ENCOUNTER — Encounter (HOSPITAL_COMMUNITY): Payer: Self-pay | Admitting: Obstetrics & Gynecology

## 2024-06-17 ENCOUNTER — Ambulatory Visit (HOSPITAL_COMMUNITY)
Admission: RE | Admit: 2024-06-17 | Discharge: 2024-06-17 | Disposition: A | Discharge status: Home / Self Care | Payer: PRIVATE HEALTH INSURANCE | Attending: Obstetrics & Gynecology | Admitting: Obstetrics & Gynecology

## 2024-06-17 ENCOUNTER — Ambulatory Visit (HOSPITAL_COMMUNITY): Payer: PRIVATE HEALTH INSURANCE | Admitting: Certified Registered"

## 2024-06-17 ENCOUNTER — Encounter (HOSPITAL_COMMUNITY)
Admission: RE | Disposition: A | Discharge status: Home / Self Care | Payer: Self-pay | Source: Home / Self Care | Attending: Obstetrics & Gynecology

## 2024-06-17 DIAGNOSIS — Z302 Encounter for sterilization: Secondary | ICD-10-CM | POA: Insufficient documentation

## 2024-06-17 DIAGNOSIS — N72 Inflammatory disease of cervix uteri: Secondary | ICD-10-CM

## 2024-06-17 DIAGNOSIS — I10 Essential (primary) hypertension: Secondary | ICD-10-CM | POA: Insufficient documentation

## 2024-06-17 DIAGNOSIS — R87611 Atypical squamous cells cannot exclude high grade squamous intraepithelial lesion on cytologic smear of cervix (ASC-H): Secondary | ICD-10-CM | POA: Diagnosis not present

## 2024-06-17 DIAGNOSIS — N870 Mild cervical dysplasia: Secondary | ICD-10-CM

## 2024-06-17 DIAGNOSIS — N879 Dysplasia of cervix uteri, unspecified: Secondary | ICD-10-CM

## 2024-06-17 DIAGNOSIS — Z01818 Encounter for other preprocedural examination: Secondary | ICD-10-CM

## 2024-06-17 HISTORY — PX: XI ROBOTIC ASSISTED SALPINGECTOMY: SHX6824

## 2024-06-17 HISTORY — PX: CERVICAL ABLATION: SHX5771

## 2024-06-17 LAB — POCT PREGNANCY, URINE: Preg Test, Ur: NEGATIVE

## 2024-06-17 SURGERY — SALPINGECTOMY, ROBOT-ASSISTED
Anesthesia: General

## 2024-06-17 MED ORDER — PROPOFOL 10 MG/ML IV BOLUS
INTRAVENOUS | Status: DC | PRN
  Administered 2024-06-17: 200 mg via INTRAVENOUS

## 2024-06-17 MED ORDER — KETOROLAC TROMETHAMINE 30 MG/ML IJ SOLN
30.0000 mg | INTRAMUSCULAR | Status: AC
  Administered 2024-06-17: 30 mg via INTRAVENOUS

## 2024-06-17 MED ORDER — ONDANSETRON HCL 4 MG/2ML IJ SOLN
INTRAMUSCULAR | Status: DC | PRN
  Administered 2024-06-17: 4 mg via INTRAVENOUS

## 2024-06-17 MED ORDER — ONDANSETRON 8 MG PO TBDP: 8.0000 mg | ORAL_TABLET | Freq: Three times a day (TID) | ORAL | 0 refills | Status: AC | PRN

## 2024-06-17 MED ORDER — PROPOFOL 500 MG/50ML IV EMUL
INTRAVENOUS | Status: AC
Start: 2024-06-17 — End: 2024-06-17
  Filled 2024-06-17: qty 50

## 2024-06-17 MED ORDER — LIDOCAINE 2% (20 MG/ML) 5 ML SYRINGE
INTRAMUSCULAR | Status: AC
  Filled 2024-06-17: qty 5

## 2024-06-17 MED ORDER — OXYCODONE HCL 5 MG/5ML PO SOLN: 5.0000 mg | Freq: Once | ORAL | Status: DC | PRN

## 2024-06-17 MED ORDER — DEXAMETHASONE SODIUM PHOSPHATE 10 MG/ML IJ SOLN
INTRAMUSCULAR | Status: AC
  Filled 2024-06-17: qty 1

## 2024-06-17 MED ORDER — MONSELS FERRIC SUBSULFATE EX SOLN
CUTANEOUS | Status: AC
Start: 2024-06-17 — End: 2024-06-17
  Filled 2024-06-17: qty 8

## 2024-06-17 MED ORDER — KETOROLAC TROMETHAMINE 30 MG/ML IJ SOLN
INTRAMUSCULAR | Status: AC
  Filled 2024-06-17: qty 1

## 2024-06-17 MED ORDER — BUPIVACAINE HCL (PF) 0.25 % IJ SOLN
INTRAMUSCULAR | Status: DC | PRN
Start: 2024-06-17 — End: 2024-06-17
  Administered 2024-06-17: 60 mL
  Administered 2024-06-17: 40 mL

## 2024-06-17 MED ORDER — ONDANSETRON HCL 4 MG/2ML IJ SOLN
INTRAMUSCULAR | Status: AC
  Filled 2024-06-17: qty 2

## 2024-06-17 MED ORDER — FENTANYL CITRATE (PF) 100 MCG/2ML IJ SOLN
INTRAMUSCULAR | Status: DC | PRN
  Administered 2024-06-17 (×4): 50 ug via INTRAVENOUS

## 2024-06-17 MED ORDER — DEXAMETHASONE SODIUM PHOSPHATE 10 MG/ML IJ SOLN
INTRAMUSCULAR | Status: DC | PRN
  Administered 2024-06-17: 5 mg via INTRAVENOUS

## 2024-06-17 MED ORDER — POVIDONE-IODINE 10 % EX SWAB: 2.0000 | Freq: Once | CUTANEOUS | Status: DC

## 2024-06-17 MED ORDER — LIDOCAINE 2% (20 MG/ML) 5 ML SYRINGE
INTRAMUSCULAR | Status: DC | PRN
  Administered 2024-06-17: 100 mg via INTRAVENOUS

## 2024-06-17 MED ORDER — KETOROLAC TROMETHAMINE 10 MG PO TABS: 10.0000 mg | ORAL_TABLET | Freq: Three times a day (TID) | ORAL | 0 refills | Status: AC | PRN

## 2024-06-17 MED ORDER — ROCURONIUM BROMIDE 10 MG/ML (PF) SYRINGE
PREFILLED_SYRINGE | INTRAVENOUS | Status: DC | PRN
  Administered 2024-06-17: 60 mg via INTRAVENOUS

## 2024-06-17 MED ORDER — CEFAZOLIN SODIUM-DEXTROSE 2-4 GM/100ML-% IV SOLN
2.0000 g | INTRAVENOUS | Status: AC
  Administered 2024-06-17: 2 g via INTRAVENOUS

## 2024-06-17 MED ORDER — ACETAMINOPHEN 10 MG/ML IV SOLN
INTRAVENOUS | Status: AC
  Filled 2024-06-17: qty 100

## 2024-06-17 MED ORDER — MIDAZOLAM HCL 2 MG/2ML IJ SOLN
INTRAMUSCULAR | Status: DC | PRN
  Administered 2024-06-17: 2 mg via INTRAVENOUS

## 2024-06-17 MED ORDER — FENTANYL CITRATE (PF) 100 MCG/2ML IJ SOLN
INTRAMUSCULAR | Status: AC
Start: 2024-06-17 — End: 2024-06-17
  Filled 2024-06-17: qty 2

## 2024-06-17 MED ORDER — BUPIVACAINE HCL (PF) 0.25 % IJ SOLN
INTRAMUSCULAR | Status: AC
Start: 2024-06-17 — End: 2024-06-17
  Filled 2024-06-17: qty 30

## 2024-06-17 MED ORDER — SCOPOLAMINE 1 MG/3DAYS TD PT72
MEDICATED_PATCH | TRANSDERMAL | Status: DC | PRN
  Administered 2024-06-17: 1 via TRANSDERMAL

## 2024-06-17 MED ORDER — EPHEDRINE SULFATE-NACL 50-0.9 MG/10ML-% IV SOSY
PREFILLED_SYRINGE | INTRAVENOUS | Status: DC | PRN
  Administered 2024-06-17: 5 mg via INTRAVENOUS

## 2024-06-17 MED ORDER — DEXMEDETOMIDINE HCL IN NACL 80 MCG/20ML IV SOLN
INTRAVENOUS | Status: AC
  Filled 2024-06-17: qty 20

## 2024-06-17 MED ORDER — ACETIC ACID 5 % SOLN
Status: DC | PRN
  Administered 2024-06-17: 1 via TOPICAL

## 2024-06-17 MED ORDER — OXYCODONE-ACETAMINOPHEN 5-325 MG PO TABS: 1.0000 | ORAL_TABLET | Freq: Four times a day (QID) | ORAL | 0 refills | Status: AC | PRN

## 2024-06-17 MED ORDER — PROPOFOL 10 MG/ML IV BOLUS
INTRAVENOUS | Status: AC
  Filled 2024-06-17: qty 20

## 2024-06-17 MED ORDER — PROPOFOL 500 MG/50ML IV EMUL
INTRAVENOUS | Status: DC | PRN
Start: 2024-06-17 — End: 2024-06-17
  Administered 2024-06-17: 20 ug/kg/min via INTRAVENOUS

## 2024-06-17 MED ORDER — ORAL CARE MOUTH RINSE
15.0000 mL | Freq: Once | OROMUCOSAL | Status: AC
Start: 2024-06-17 — End: 2024-06-17

## 2024-06-17 MED ORDER — DEXMEDETOMIDINE HCL IN NACL 80 MCG/20ML IV SOLN
INTRAVENOUS | Status: DC | PRN
Start: 2024-06-17 — End: 2024-06-17
  Administered 2024-06-17: 12 ug via INTRAVENOUS
  Administered 2024-06-17 (×3): 8 ug via INTRAVENOUS

## 2024-06-17 MED ORDER — EPHEDRINE 5 MG/ML INJ
INTRAVENOUS | Status: AC
  Filled 2024-06-17: qty 5

## 2024-06-17 MED ORDER — PROPOFOL 10 MG/ML IV BOLUS
INTRAVENOUS | Status: AC
Start: 2024-06-17 — End: 2024-06-17
  Filled 2024-06-17: qty 20

## 2024-06-17 MED ORDER — SCOPOLAMINE 1 MG/3DAYS TD PT72
MEDICATED_PATCH | TRANSDERMAL | Status: AC
  Filled 2024-06-17: qty 1

## 2024-06-17 MED ORDER — STERILE WATER FOR IRRIGATION IR SOLN
Status: DC | PRN
  Administered 2024-06-17: 1500 mL

## 2024-06-17 MED ORDER — CHLORHEXIDINE GLUCONATE 0.12 % MT SOLN
15.0000 mL | Freq: Once | OROMUCOSAL | Status: AC
  Administered 2024-06-17: 15 mL via OROMUCOSAL

## 2024-06-17 MED ORDER — MIDAZOLAM HCL 2 MG/2ML IJ SOLN
INTRAMUSCULAR | Status: AC
  Filled 2024-06-17: qty 2

## 2024-06-17 MED ORDER — ROCURONIUM BROMIDE 10 MG/ML (PF) SYRINGE
PREFILLED_SYRINGE | INTRAVENOUS | Status: AC
  Filled 2024-06-17: qty 10

## 2024-06-17 MED ORDER — ONDANSETRON HCL 4 MG/2ML IJ SOLN: 4.0000 mg | Freq: Once | INTRAMUSCULAR | Status: DC | PRN

## 2024-06-17 MED ORDER — SUGAMMADEX SODIUM 200 MG/2ML IV SOLN
INTRAVENOUS | Status: DC | PRN
  Administered 2024-06-17: 200 mg via INTRAVENOUS

## 2024-06-17 MED ORDER — MONSELS FERRIC SUBSULFATE EX SOLN
CUTANEOUS | Status: DC | PRN
  Administered 2024-06-17: 1 via TOPICAL

## 2024-06-17 MED ORDER — FENTANYL CITRATE (PF) 100 MCG/2ML IJ SOLN
INTRAMUSCULAR | Status: AC
  Filled 2024-06-17: qty 2

## 2024-06-17 MED ORDER — LACTATED RINGERS IV SOLN: INTRAVENOUS | Status: DC

## 2024-06-17 MED ORDER — FENTANYL CITRATE PF 50 MCG/ML IJ SOSY: 25.0000 ug | PREFILLED_SYRINGE | INTRAMUSCULAR | Status: DC | PRN

## 2024-06-17 MED ORDER — OXYCODONE HCL 5 MG PO TABS: 5.0000 mg | ORAL_TABLET | Freq: Once | ORAL | Status: DC | PRN

## 2024-06-17 MED ORDER — ACETAMINOPHEN 10 MG/ML IV SOLN
INTRAVENOUS | Status: DC | PRN
  Administered 2024-06-17: 1000 mg via INTRAVENOUS

## 2024-06-17 SURGICAL SUPPLY — 44 items
BAG HAMPER (MISCELLANEOUS) ×2 IMPLANT
CATH ROBINSON RED A/P 16FR (CATHETERS) IMPLANT
CLOTH BEACON ORANGE TIMEOUT ST (SAFETY) ×2 IMPLANT
COVER LIGHT HANDLE STERIS (MISCELLANEOUS) ×4 IMPLANT
COVER MAYO STAND XLG (MISCELLANEOUS) ×2 IMPLANT
DERMABOND ADVANCED .7 DNX12 (GAUZE/BANDAGES/DRESSINGS) ×2 IMPLANT
DRAPE ARM DVNC X/XI (DISPOSABLE) ×6 IMPLANT
DRAPE COLUMN DVNC XI (DISPOSABLE) ×2 IMPLANT
ELECTRODE REM PT RTRN 9FT ADLT (ELECTROSURGICAL) ×2 IMPLANT
FORCEPS PROGRASP DVNC XI (FORCEP) ×2 IMPLANT
GAUZE 4X4 16PLY ~~LOC~~+RFID DBL (SPONGE) ×2 IMPLANT
GLOVE BIO SURGEON STRL SZ 6.5 (GLOVE) IMPLANT
GLOVE BIOGEL PI IND STRL 7.0 (GLOVE) ×6 IMPLANT
GLOVE BIOGEL PI IND STRL 8 (GLOVE) ×4 IMPLANT
GLOVE ECLIPSE 8.0 STRL XLNG CF (GLOVE) ×4 IMPLANT
GLOVE INDICATOR 6.5 STRL GRN (GLOVE) IMPLANT
GOWN STRL REUS W/TWL LRG LVL3 (GOWN DISPOSABLE) ×2 IMPLANT
GOWN STRL REUS W/TWL XL LVL3 (GOWN DISPOSABLE) ×4 IMPLANT
KIT PINK PAD W/HEAD ARM REST (MISCELLANEOUS) ×2 IMPLANT
KIT TURNOVER KIT A (KITS) ×2 IMPLANT
LASER FIBER 1000M SMARTSCOPE (Laser) ×2 IMPLANT
MANIFOLD NEPTUNE II (INSTRUMENTS) ×2 IMPLANT
MARKER SKIN DUAL TIP RULER LAB (MISCELLANEOUS) ×2 IMPLANT
NDL HYPO 21X1.5 SAFETY (NEEDLE) ×2 IMPLANT
NDL INSUFFLATION 14GA 120MM (NEEDLE) ×2 IMPLANT
NEEDLE HYPO 21X1.5 SAFETY (NEEDLE) ×2 IMPLANT
NEEDLE INSUFFLATION 14GA 120MM (NEEDLE) ×2 IMPLANT
OBTURATOR OPTICALSTD 8 DVNC (TROCAR) ×2 IMPLANT
PACK LAP CHOLE LZT030E (CUSTOM PROCEDURE TRAY) ×2 IMPLANT
PACK SRG BSC III STRL LF ECLPS (CUSTOM PROCEDURE TRAY) ×2 IMPLANT
PAD ARMBOARD POSITIONER FOAM (MISCELLANEOUS) ×2 IMPLANT
POSITIONER HEAD 8X9X4 ADT (SOFTGOODS) ×2 IMPLANT
SCOPETTES 8 STERILE (MISCELLANEOUS) ×2 IMPLANT
SEAL UNIV 5-12 XI (MISCELLANEOUS) ×6 IMPLANT
SEALER VESSEL EXT DVNC XI (MISCELLANEOUS) ×2 IMPLANT
SET BASIN LINEN APH (SET/KITS/TRAYS/PACK) ×2 IMPLANT
SET TUBE SMOKE EVAC HIGH FLOW (TUBING) ×2 IMPLANT
SHEET LAVH (DRAPES) ×2 IMPLANT
SUT VICRYL 0 UR6 27IN ABS (SUTURE) ×2 IMPLANT
SUT VICRYL AB 3-0 FS1 BRD 27IN (SUTURE) ×4 IMPLANT
SWAB PROCTOSCOPIC (MISCELLANEOUS) ×2 IMPLANT
TAPE TRANSPORE STRL 2 31045 (GAUZE/BANDAGES/DRESSINGS) ×2 IMPLANT
TUBING SMOKE EVAC CO2 (TUBING) ×2 IMPLANT
WATER STERILE IRR 500ML POUR (IV SOLUTION) ×2 IMPLANT

## 2024-06-17 NOTE — Transfer of Care (Signed)
 Immediate Anesthesia Transfer of Care Note  Patient: Sharon Murillo  Procedure(s) Performed: SALPINGECTOMY, ROBOT-ASSISTED (Bilateral) ABLATION, CERVIX  Patient Location: PACU  Anesthesia Type:General  Level of Consciousness: awake  Airway & Oxygen Therapy: Patient Spontanous Breathing and Patient connected to face mask oxygen  Post-op Assessment: Report given to RN and Post -op Vital signs reviewed and stable  Post vital signs: Reviewed and stable  Last Vitals:  Vitals Value Taken Time  BP 123/81 06/17/24 09:18  Temp 97.5   Pulse 50 06/17/24 09:19  Resp 15 06/17/24 09:19  SpO2 94 % 06/17/24 09:19  Vitals shown include unfiled device data.  Last Pain: There were no vitals filed for this visit.       Complications: No notable events documented.

## 2024-06-17 NOTE — Anesthesia Procedure Notes (Signed)
 Procedure Name: Intubation Date/Time: 06/17/2024 7:38 AM  Performed by: Sherwin Donate, CRNAPre-anesthesia Checklist: Patient identified, Emergency Drugs available, Suction available and Patient being monitored Patient Re-evaluated:Patient Re-evaluated prior to induction Oxygen Delivery Method: Circle system utilized Preoxygenation: Pre-oxygenation with 100% oxygen Induction Type: IV induction Ventilation: Mask ventilation without difficulty Laryngoscope Size: Miller and 3 Grade View: Grade I Tube type: Oral Tube size: 7.0 mm Number of attempts: 1 Airway Equipment and Method: Stylet Placement Confirmation: ETT inserted through vocal cords under direct vision, positive ETCO2 and breath sounds checked- equal and bilateral Secured at: 22 cm Tube secured with: Tape Dental Injury: Teeth and Oropharynx as per pre-operative assessment

## 2024-06-17 NOTE — Anesthesia Preprocedure Evaluation (Signed)
 Anesthesia Evaluation  Patient identified by MRN, date of birth, ID band Patient awake    Reviewed: Allergy & Precautions, H&P , NPO status , Patient's Chart, lab work & pertinent test results, reviewed documented beta blocker date and time   Airway Mallampati: II  TM Distance: >3 FB Neck ROM: full    Dental no notable dental hx.    Pulmonary neg pulmonary ROS   Pulmonary exam normal breath sounds clear to auscultation       Cardiovascular Exercise Tolerance: Good hypertension, negative cardio ROS  Rhythm:regular Rate:Normal     Neuro/Psych negative neurological ROS  negative psych ROS   GI/Hepatic negative GI ROS, Neg liver ROS,,,  Endo/Other  negative endocrine ROS    Renal/GU negative Renal ROS  negative genitourinary   Musculoskeletal   Abdominal   Peds  Hematology negative hematology ROS (+)   Anesthesia Other Findings   Reproductive/Obstetrics negative OB ROS                             Anesthesia Physical Anesthesia Plan  ASA: 2  Anesthesia Plan: General and General ETT   Post-op Pain Management:    Induction:   PONV Risk Score and Plan: Ondansetron  Airway Management Planned:   Additional Equipment:   Intra-op Plan:   Post-operative Plan:   Informed Consent: I have reviewed the patients History and Physical, chart, labs and discussed the procedure including the risks, benefits and alternatives for the proposed anesthesia with the patient or authorized representative who has indicated his/her understanding and acceptance.     Dental Advisory Given  Plan Discussed with: CRNA  Anesthesia Plan Comments:        Anesthesia Quick Evaluation

## 2024-06-17 NOTE — H&P (Signed)
 Preoperative History and Physical  Sharon Murillo is a 43 y.o. [id number] with No LMP recorded. admitted for a RA laparoscopic bilateral salpingectomy.    PMH:    Past Medical History:  Diagnosis Date   Cancer (HCC)    skin   Contraceptive management 03/10/2014   Irregular intermenstrual bleeding 03/24/2015    PSH:     Past Surgical History:  Procedure Laterality Date   SKIN CANCER EXCISION     TONSILLECTOMY      POb/GynH:      OB History     Gravida  3   Para  2   Term  2   Preterm      AB  1   Living  2      SAB  1   IAB      Ectopic      Multiple      Live Births  2           SH:   Social History   Tobacco Use   Smoking status: Never   Smokeless tobacco: Never  Vaping Use   Vaping status: Never Used  Substance Use Topics   Alcohol use: No    Comment: occasionally   Drug use: No    FH:    Family History  Problem Relation Age of Onset   Heart disease Paternal Grandfather    Cancer Paternal Grandfather        lung   Diabetes Paternal Grandmother    Stroke Maternal Grandmother        mini   Cancer Maternal Grandfather        skin   Heart disease Maternal Grandfather    Diabetes Father    Hypertension Father    Sleep apnea Father    Osteoporosis Mother    Other Neg Hx      Allergies: No Known Allergies  Medications:       Current Facility-Administered Medications:    ceFAZolin (ANCEF) IVPB 2g/100 mL premix, 2 g, Intravenous, On Call to OR, Wendelyn Halter, MD   chlorhexidine  (PERIDEX) 0.12 % solution 15 mL, 15 mL, Mouth/Throat, Once **OR** Oral care mouth rinse, 15 mL, Mouth Rinse, Once, Kiel, Allan Arab, MD   ketorolac (TORADOL) 30 MG/ML injection 30 mg, 30 mg, Intravenous, On Call to OR, Wendelyn Halter, MD   ketorolac (TORADOL) 30 MG/ML injection, , , ,    lactated ringers  infusion, , Intravenous, Continuous, Kiel, Allan Arab, MD   povidone-iodine 10 % swab 2 Application, 2 Application, Topical, Once, Wendelyn Halter,  MD  Review of Systems:   Review of Systems  Constitutional: Negative for fever, chills, weight loss, malaise/fatigue and diaphoresis.  HENT: Negative for hearing loss, ear pain, nosebleeds, congestion, sore throat, neck pain, tinnitus and ear discharge.   Eyes: Negative for blurred vision, double vision, photophobia, pain, discharge and redness.  Respiratory: Negative for cough, hemoptysis, sputum production, shortness of breath, wheezing and stridor.   Cardiovascular: Negative for chest pain, palpitations, orthopnea, claudication, leg swelling and PND.  Gastrointestinal: Positive for abdominal pain. Negative for heartburn, nausea, vomiting, diarrhea, constipation, blood in stool and melena.  Genitourinary: Negative for dysuria, urgency, frequency, hematuria and flank pain.  Musculoskeletal: Negative for myalgias, back pain, joint pain and falls.  Skin: Negative for itching and rash.  Neurological: Negative for dizziness, tingling, tremors, sensory change, speech change, focal weakness, seizures, loss of consciousness, weakness and headaches.  Endo/Heme/Allergies: Negative for environmental allergies and polydipsia. Does not  bruise/bleed easily.  Psychiatric/Behavioral: Negative for depression, suicidal ideas, hallucinations, memory loss and substance abuse. The patient is not nervous/anxious and does not have insomnia.      PHYSICAL EXAM:  There were no vitals taken for this visit.    Vitals reviewed. Constitutional: She is oriented to person, place, and time. She appears well-developed and well-nourished.  HENT:  Head: Normocephalic and atraumatic.  Right Ear: External ear normal.  Left Ear: External ear normal.  Nose: Nose normal.  Mouth/Throat: Oropharynx is clear and moist.  Eyes: Conjunctivae and EOM are normal. Pupils are equal, round, and reactive to light. Right eye exhibits no discharge. Left eye exhibits no discharge. No scleral icterus.  Neck: Normal range of motion.  Neck supple. No tracheal deviation present. No thyromegaly present.  Cardiovascular: Normal rate, regular rhythm, normal heart sounds and intact distal pulses.  Exam reveals no gallop and no friction rub.   No murmur heard. Respiratory: Effort normal and breath sounds normal. No respiratory distress. She has no wheezes. She has no rales. She exhibits no tenderness.  GI: Soft. Bowel sounds are normal. She exhibits no distension and no mass. There is tenderness. There is no rebound and no guarding.  Genitourinary:       Vulva is normal without lesions Vagina is pink moist without discharge Cervix normal in appearance and pap is normal Uterus is normal size, contour, position, consistency, mobility, non-tender Adnexa is negative with normal sized ovaries by sonogram  Musculoskeletal: Normal range of motion. She exhibits no edema and no tenderness.  Neurological: She is alert and oriented to person, place, and time. She has normal reflexes. She displays normal reflexes. No cranial nerve deficit. She exhibits normal muscle tone. Coordination normal.  Skin: Skin is warm and dry. No rash noted. No erythema. No pallor.  Psychiatric: She has a normal mood and affect. Her behavior is normal. Judgment and thought content normal.    Labs: Results for orders placed or performed during the hospital encounter of 06/17/24 (from the past 2 weeks)  Pregnancy, urine POC   Collection Time: 06/17/24  6:41 AM  Result Value Ref Range   Preg Test, Ur NEGATIVE NEGATIVE  Results for orders placed or performed during the hospital encounter of 06/15/24 (from the past 2 weeks)  Pregnancy, urine   Collection Time: 06/15/24  9:25 AM  Result Value Ref Range   Preg Test, Ur NEGATIVE NEGATIVE    EKG: No orders found for this or any previous visit.  Imaging Studies: No results found.    Assessment: Multiparous female desires permanent sterilization  Plan: RA Lap BS  Wendelyn Halter 06/17/2024 7:18  AM

## 2024-06-17 NOTE — Op Note (Signed)
 PLEASE NOTE THERE ARE 2 SEPARATE OP NOTES HERE    Preoperative Diagnosis:  1.  Parous female desires permanent sterilization                                          2.  Elects to have bilateral salpingectomy for ovarian cancer prophylaxis   Postoperative Diagnosis:  Same as above   Procedure:  Robot assisted laparoscopic Bilateral Salpingectomy for the purpose of permanent sterilization                    Placement of a TAP block   Surgeon:  Laron Plummer MD   Anaesthesia: general   Findings:  Patient had normal pelvic anatomy and no intraperitoneal abnormalities.   Description of Operation:  Patient was taken to the OR and placed into supine position where she underwent general anaesthesia.   She was  prepped and draped in the usual sterile fashion.   An incision was made superior to the umbilicus and dissection taken down to the rectus fascia. A Veres needle was used to insufflate the periotneal cavity. An 8 mm non bladed video laparoscope trocar port was then placed under direct visualization without difficulty.   The above noted findings were observed.   Two additional 8 mm non bladed trocar ports were placed to the right and left of the umbilicus lateral to the rectus anterior muscle under direct visualization without difficulty.      The  Robot was docked to the ports 30 degree scope was used in the umbilical port A vessel sealer estended was in the right port A prograsp was placed in the right port VSE was used to hemostatically remove both tubes for the ovarian attachment and to coagulate and cut across the mesosalpinx bilaterally The entire fimbria and Fallopian tube were removed all the way to the uterine cornua The mesosalpinx of both fallopian tubes were hemostatic The removed fallopian tubes were removed through the left 8 mm port using an alligator grasper  A transversus abdominus plane block was placed using laparoscopic guidance at T10 and T7 bilaterally, 10 cc  of 0.25% bupivacaine at each site, 40 cc total volume and  100 mg total of bupivacaine.  Doyle's bubble technique was used.   The umbilical incision fascia and subcutaneous tissue was reapproximated with 2-0 Vicryl All 3 skin incisions were closed using 3-0 vicryl in a subcuticular fashion.  Dermabond was applied to each incision    The patient was awakened from anaesthesia and taken to the PACU with all counts being correct x 3.     The patient received  2 gram of ancef andToradol 30 mg IV preoperatively.   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  Laser was used on typical cervical settings and used to vaporized the squamocolumnar junction to  depth of  5-7 mm peripherally and 7-9 mm centrally.  Surgical margin of several mm was employed beyond the acetowhite epithelium.  Hemostasis was achieved with the laser and Monsel's solution.  Patient was awakened from anaesthesia in good stable condition and all counts were correct.   No blood loss  Wendelyn Halter, MD 06/17/2024 9:25 AM

## 2024-06-18 ENCOUNTER — Encounter (HOSPITAL_COMMUNITY): Payer: Self-pay | Admitting: Obstetrics & Gynecology

## 2024-06-18 LAB — SURGICAL PATHOLOGY

## 2024-06-19 NOTE — Anesthesia Postprocedure Evaluation (Signed)
 Anesthesia Post Note  Patient: Sharon Murillo  Procedure(s) Performed: SALPINGECTOMY, ROBOT-ASSISTED (Bilateral) ABLATION, CERVIX  Patient location during evaluation: Phase II Anesthesia Type: General Level of consciousness: awake Pain management: pain level controlled Vital Signs Assessment: post-procedure vital signs reviewed and stable Respiratory status: spontaneous breathing and respiratory function stable Cardiovascular status: blood pressure returned to baseline and stable Postop Assessment: no headache and no apparent nausea or vomiting Anesthetic complications: no Comments: Late entry   No notable events documented.   Last Vitals:  Vitals:   06/17/24 0945 06/17/24 1002  BP: 111/69 118/84  Pulse: 65 (!) 57  Resp: 14 15  Temp:    SpO2: 97% 100%    Last Pain:  Vitals:   06/18/24 1458  TempSrc:   PainSc: 0-No pain                 Coretha Dew

## 2024-07-06 ENCOUNTER — Encounter: Payer: Self-pay | Admitting: Obstetrics & Gynecology

## 2024-07-06 ENCOUNTER — Ambulatory Visit: Payer: PRIVATE HEALTH INSURANCE | Admitting: Obstetrics & Gynecology

## 2024-07-06 VITALS — BP 137/81 | HR 76 | Ht 62.0 in | Wt 144.0 lb

## 2024-07-06 DIAGNOSIS — Z9889 Other specified postprocedural states: Secondary | ICD-10-CM | POA: Diagnosis not present

## 2024-07-06 NOTE — Progress Notes (Signed)
  HPI: Patient returns for routine postoperative follow-up having undergone RA BS + laser ablation of the cervix on 06/17/24.  The patient's immediate postoperative recovery has been unremarkable. Since hospital discharge the patient reports no problems.   Current Outpatient Medications: diphenhydrAMINE  (BENADRYL ) 25 MG tablet, Take 25 mg by mouth at bedtime as needed for sleep. (Patient not taking: Reported on 07/06/2024), Disp: , Rfl:  ibuprofen  (ADVIL ) 200 MG tablet, Take 400 mg by mouth every 6 (six) hours as needed (pain.). (Patient not taking: Reported on 07/06/2024), Disp: , Rfl:  ketorolac  (TORADOL ) 10 MG tablet, Take 1 tablet (10 mg total) by mouth every 8 (eight) hours as needed. (Patient not taking: Reported on 07/06/2024), Disp: 15 tablet, Rfl: 0 ondansetron  (ZOFRAN -ODT) 8 MG disintegrating tablet, Take 1 tablet (8 mg total) by mouth every 8 (eight) hours as needed for nausea or vomiting. (Patient not taking: Reported on 07/06/2024), Disp: 8 tablet, Rfl: 0 oxyCODONE -acetaminophen  (PERCOCET) 5-325 MG tablet, Take 1 tablet by mouth every 6 (six) hours as needed for severe pain (pain score 7-10). (Patient not taking: Reported on 07/06/2024), Disp: 10 tablet, Rfl: 0  No current facility-administered medications for this visit.    Blood pressure 137/81, pulse 76, height 5' 2 (1.575 m), weight 144 lb (65.3 kg).  Physical Exam: 3 incisions clean dry intact  Diagnostic Tests:   Pathology: benign  Impression + Management plan: (S01.109) Post-operative state: RA BS + laser ablation of the cervix  (primary encounter diagnosis)      Medications Prescribed this encounter: No orders of the defined types were placed in this encounter.     Follow up: Return in about 6 months (around 01/06/2025) for follow up cytology only.    Vonn VEAR Inch, MD Attending Physician for the Center for [facility] and [facility] 07/06/2024 4:44 PM
